# Patient Record
Sex: Female | Born: 1945 | Race: White | Hispanic: No
Health system: Southern US, Community
[De-identification: ages and names within clinical notes are randomized; demographics above are authoritative.]

## PROBLEM LIST (undated history)

## (undated) DIAGNOSIS — G629 Polyneuropathy, unspecified: Secondary | ICD-10-CM

## (undated) DIAGNOSIS — I10 Essential (primary) hypertension: Secondary | ICD-10-CM

## (undated) DIAGNOSIS — E119 Type 2 diabetes mellitus without complications: Secondary | ICD-10-CM

## (undated) HISTORY — PX: TOE AMPUTATION: SHX809

---

## 2004-03-28 ENCOUNTER — Ambulatory Visit: Payer: Self-pay | Admitting: Anesthesiology

## 2004-04-24 ENCOUNTER — Ambulatory Visit: Payer: Self-pay | Admitting: Anesthesiology

## 2004-05-21 ENCOUNTER — Ambulatory Visit: Payer: Self-pay | Admitting: Anesthesiology

## 2004-06-18 ENCOUNTER — Ambulatory Visit: Payer: Self-pay | Admitting: Anesthesiology

## 2004-09-17 ENCOUNTER — Ambulatory Visit: Payer: Self-pay | Admitting: Anesthesiology

## 2004-10-10 ENCOUNTER — Ambulatory Visit: Payer: Self-pay | Admitting: Anesthesiology

## 2007-07-14 ENCOUNTER — Emergency Department (HOSPITAL_COMMUNITY): Admission: EM | Admit: 2007-07-14 | Discharge: 2007-07-14 | Payer: Self-pay | Admitting: Emergency Medicine

## 2007-07-29 ENCOUNTER — Encounter (INDEPENDENT_AMBULATORY_CARE_PROVIDER_SITE_OTHER): Payer: Self-pay | Admitting: *Deleted

## 2007-07-29 ENCOUNTER — Ambulatory Visit: Payer: Self-pay

## 2007-10-31 ENCOUNTER — Emergency Department (HOSPITAL_COMMUNITY): Admission: EM | Admit: 2007-10-31 | Discharge: 2007-10-31 | Payer: Self-pay | Admitting: Emergency Medicine

## 2008-09-27 ENCOUNTER — Emergency Department: Payer: Self-pay | Admitting: Emergency Medicine

## 2008-10-10 ENCOUNTER — Ambulatory Visit: Payer: Self-pay | Admitting: Family Medicine

## 2009-09-27 ENCOUNTER — Inpatient Hospital Stay (HOSPITAL_COMMUNITY): Admission: EM | Admit: 2009-09-27 | Discharge: 2009-09-30 | Payer: Self-pay | Admitting: Emergency Medicine

## 2009-09-27 ENCOUNTER — Encounter: Payer: Self-pay | Admitting: Internal Medicine

## 2009-09-27 ENCOUNTER — Ambulatory Visit: Payer: Self-pay | Admitting: Internal Medicine

## 2009-09-28 ENCOUNTER — Ambulatory Visit: Payer: Self-pay | Admitting: Internal Medicine

## 2010-07-08 LAB — BASIC METABOLIC PANEL
BUN: 15 mg/dL (ref 6–23)
BUN: 28 mg/dL — ABNORMAL HIGH (ref 6–23)
CO2: 31 mEq/L (ref 19–32)
Calcium: 8.2 mg/dL — ABNORMAL LOW (ref 8.4–10.5)
Calcium: 8.7 mg/dL (ref 8.4–10.5)
Chloride: 101 mEq/L (ref 96–112)
Chloride: 106 mEq/L (ref 96–112)
Chloride: 99 mEq/L (ref 96–112)
Creatinine, Ser: 0.89 mg/dL (ref 0.4–1.2)
Creatinine, Ser: 0.94 mg/dL (ref 0.4–1.2)
Creatinine, Ser: 1.28 mg/dL — ABNORMAL HIGH (ref 0.4–1.2)
GFR calc Af Amer: 51 mL/min — ABNORMAL LOW (ref >60)
GFR calc non Af Amer: 42 mL/min — ABNORMAL LOW (ref >60)
GFR calc non Af Amer: >60 mL/min (ref >60)
Glucose, Bld: 158 mg/dL — ABNORMAL HIGH (ref 70–99)
Glucose, Bld: 183 mg/dL — ABNORMAL HIGH (ref 70–99)
Glucose, Bld: 191 mg/dL — ABNORMAL HIGH (ref 70–99)
Potassium: 3.8 mEq/L (ref 3.5–5.1)
Potassium: 3.9 mEq/L (ref 3.5–5.1)
Sodium: 138 mEq/L (ref 135–145)

## 2010-07-08 LAB — DIFFERENTIAL
Eosinophils Relative: 2 % (ref 0–5)
Lymphs Abs: 2 10*3/uL (ref 0.7–4.0)
Monocytes Relative: 8 % (ref 3–12)
Neutro Abs: 5.8 10*3/uL (ref 1.7–7.7)
Neutrophils Relative %: 67 % (ref 43–77)

## 2010-07-08 LAB — CBC
HCT: 27.4 % — ABNORMAL LOW (ref 36.0–46.0)
HCT: 28 % — ABNORMAL LOW (ref 36.0–46.0)
HCT: 30.8 % — ABNORMAL LOW (ref 36.0–46.0)
Hemoglobin: 10.6 g/dL — ABNORMAL LOW (ref 12.0–15.0)
Hemoglobin: 9.2 g/dL — ABNORMAL LOW (ref 12.0–15.0)
MCHC: 33.6 g/dL (ref 30.0–36.0)
MCHC: 34.3 g/dL (ref 30.0–36.0)
MCHC: 34.4 g/dL (ref 30.0–36.0)
MCV: 90.2 fL (ref 78.0–100.0)
MCV: 90.4 fL (ref 78.0–100.0)
Platelets: 190 10*3/uL (ref 150–400)
Platelets: 205 10*3/uL (ref 150–400)
Platelets: 211 10*3/uL (ref 150–400)
RBC: 3.41 MIL/uL — ABNORMAL LOW (ref 3.87–5.11)
RDW: 12.5 % (ref 11.5–15.5)
RDW: 12.5 % (ref 11.5–15.5)
WBC: 8.6 10*3/uL (ref 4.0–10.5)
WBC: 9.6 10*3/uL (ref 4.0–10.5)

## 2010-07-08 LAB — GLUCOSE, CAPILLARY
Glucose-Capillary: 147 mg/dL — ABNORMAL HIGH (ref 70–99)
Glucose-Capillary: 159 mg/dL — ABNORMAL HIGH (ref 70–99)
Glucose-Capillary: 167 mg/dL — ABNORMAL HIGH (ref 70–99)
Glucose-Capillary: 195 mg/dL — ABNORMAL HIGH (ref 70–99)
Glucose-Capillary: 207 mg/dL — ABNORMAL HIGH (ref 70–99)
Glucose-Capillary: 236 mg/dL — ABNORMAL HIGH (ref 70–99)
Glucose-Capillary: 258 mg/dL — ABNORMAL HIGH (ref 70–99)
Glucose-Capillary: 93 mg/dL (ref 70–99)

## 2010-07-08 LAB — TSH: TSH: 0.542 u[IU]/mL (ref 0.350–4.500)

## 2010-07-08 LAB — LIPID PANEL
Cholesterol: 106 mg/dL (ref 0–200)
LDL Cholesterol: 53 mg/dL (ref 0–99)
Total CHOL/HDL Ratio: 3.3 RATIO

## 2010-07-08 LAB — CULTURE, BLOOD (ROUTINE X 2)

## 2010-07-08 LAB — WOUND CULTURE

## 2010-07-08 LAB — HEMOGLOBIN A1C: Hgb A1c MFr Bld: 7.5 % — ABNORMAL HIGH (ref <5.7)

## 2011-01-13 LAB — CBC
HCT: 37.5
Hemoglobin: 12.7
MCHC: 33.9
MCV: 90.1
Platelets: 206
RDW: 13.6

## 2011-01-13 LAB — POCT I-STAT, CHEM 8
BUN: 27 — ABNORMAL HIGH
Calcium, Ion: 1.2
HCT: 40
Hemoglobin: 13.6
Sodium: 139
TCO2: 31

## 2011-01-13 LAB — DIFFERENTIAL
Basophils Absolute: 0.1
Basophils Relative: 1
Eosinophils Absolute: 0.1
Eosinophils Relative: 1
Monocytes Absolute: 0.6

## 2011-01-13 LAB — POCT CARDIAC MARKERS: Operator id: 295131

## 2011-01-16 LAB — URINALYSIS, ROUTINE W REFLEX MICROSCOPIC
Glucose, UA: 100 — AB
Ketones, ur: NEGATIVE
pH: 6

## 2011-01-16 LAB — URINE MICROSCOPIC-ADD ON

## 2011-01-16 LAB — POCT I-STAT, CHEM 8
BUN: 14
Hemoglobin: 10.2 — ABNORMAL LOW
Potassium: 3.5
Sodium: 134 — ABNORMAL LOW
TCO2: 28

## 2011-06-03 ENCOUNTER — Ambulatory Visit: Payer: Self-pay | Admitting: Podiatry

## 2011-06-07 LAB — WOUND CULTURE

## 2013-05-09 LAB — BASIC METABOLIC PANEL
BUN: 33 mg/dL — AB (ref 4–21)
Creatinine: 1.6 mg/dL — AB (ref 0.5–1.1)
GLUCOSE: 303 mg/dL
Sodium: 138 mmol/L (ref 137–147)

## 2013-11-10 ENCOUNTER — Other Ambulatory Visit: Payer: Self-pay | Admitting: Podiatry

## 2013-11-14 LAB — WOUND CULTURE

## 2013-11-16 ENCOUNTER — Ambulatory Visit: Payer: Self-pay | Admitting: Podiatry

## 2013-11-16 LAB — BASIC METABOLIC PANEL
Anion Gap: 6 — ABNORMAL LOW (ref 7–16)
BUN: 39 mg/dL — ABNORMAL HIGH (ref 7–18)
CALCIUM: 8.9 mg/dL (ref 8.5–10.1)
CHLORIDE: 108 mmol/L — AB (ref 98–107)
Co2: 23 mmol/L (ref 21–32)
Creatinine: 2.06 mg/dL — ABNORMAL HIGH (ref 0.60–1.30)
EGFR (Non-African Amer.): 24 — ABNORMAL LOW
GFR CALC AF AMER: 28 — AB
GLUCOSE: 136 mg/dL — AB (ref 65–99)
Osmolality: 285 (ref 275–301)
Potassium: 5.4 mmol/L — ABNORMAL HIGH (ref 3.5–5.1)
Sodium: 137 mmol/L (ref 136–145)

## 2013-11-16 LAB — WBC: WBC: 7.3 10*3/uL (ref 3.6–11.0)

## 2013-11-16 LAB — HEMOGLOBIN: HGB: 9.8 g/dL — ABNORMAL LOW (ref 12.0–16.0)

## 2013-11-18 ENCOUNTER — Ambulatory Visit: Payer: Self-pay | Admitting: Podiatry

## 2013-11-21 LAB — PATHOLOGY REPORT

## 2013-12-15 LAB — WOUND CULTURE

## 2014-05-11 LAB — HEMOGLOBIN A1C: HEMOGLOBIN A1C: 8.7

## 2014-07-02 ENCOUNTER — Encounter (HOSPITAL_COMMUNITY): Payer: Self-pay | Admitting: Emergency Medicine

## 2014-07-02 ENCOUNTER — Emergency Department (HOSPITAL_COMMUNITY): Payer: Medicare PPO

## 2014-07-02 ENCOUNTER — Inpatient Hospital Stay (HOSPITAL_COMMUNITY)
Admission: EM | Admit: 2014-07-02 | Discharge: 2014-07-05 | DRG: 558 | Disposition: A | Discharge status: Home Health | Payer: Medicare PPO | Attending: Internal Medicine | Admitting: Internal Medicine

## 2014-07-02 DIAGNOSIS — E1142 Type 2 diabetes mellitus with diabetic polyneuropathy: Secondary | ICD-10-CM | POA: Diagnosis present

## 2014-07-02 DIAGNOSIS — Z89422 Acquired absence of other left toe(s): Secondary | ICD-10-CM

## 2014-07-02 DIAGNOSIS — R32 Unspecified urinary incontinence: Secondary | ICD-10-CM | POA: Diagnosis present

## 2014-07-02 DIAGNOSIS — E860 Dehydration: Secondary | ICD-10-CM | POA: Diagnosis present

## 2014-07-02 DIAGNOSIS — M6282 Rhabdomyolysis: Secondary | ICD-10-CM | POA: Diagnosis not present

## 2014-07-02 DIAGNOSIS — T501X5A Adverse effect of loop [high-ceiling] diuretics, initial encounter: Secondary | ICD-10-CM | POA: Diagnosis present

## 2014-07-02 DIAGNOSIS — N390 Urinary tract infection, site not specified: Secondary | ICD-10-CM | POA: Diagnosis present

## 2014-07-02 DIAGNOSIS — W19XXXA Unspecified fall, initial encounter: Secondary | ICD-10-CM | POA: Diagnosis present

## 2014-07-02 DIAGNOSIS — G629 Polyneuropathy, unspecified: Secondary | ICD-10-CM

## 2014-07-02 DIAGNOSIS — I10 Essential (primary) hypertension: Secondary | ICD-10-CM | POA: Diagnosis present

## 2014-07-02 DIAGNOSIS — T796XXA Traumatic ischemia of muscle, initial encounter: Secondary | ICD-10-CM

## 2014-07-02 DIAGNOSIS — F17210 Nicotine dependence, cigarettes, uncomplicated: Secondary | ICD-10-CM | POA: Diagnosis present

## 2014-07-02 DIAGNOSIS — N179 Acute kidney failure, unspecified: Secondary | ICD-10-CM | POA: Diagnosis present

## 2014-07-02 DIAGNOSIS — R531 Weakness: Secondary | ICD-10-CM

## 2014-07-02 DIAGNOSIS — D638 Anemia in other chronic diseases classified elsewhere: Secondary | ICD-10-CM | POA: Diagnosis present

## 2014-07-02 DIAGNOSIS — T464X5A Adverse effect of angiotensin-converting-enzyme inhibitors, initial encounter: Secondary | ICD-10-CM | POA: Diagnosis present

## 2014-07-02 DIAGNOSIS — E119 Type 2 diabetes mellitus without complications: Secondary | ICD-10-CM

## 2014-07-02 HISTORY — DX: Polyneuropathy, unspecified: G62.9

## 2014-07-02 HISTORY — DX: Type 2 diabetes mellitus without complications: E11.9

## 2014-07-02 HISTORY — DX: Essential (primary) hypertension: I10

## 2014-07-02 LAB — COMPREHENSIVE METABOLIC PANEL
ALBUMIN: 3.5 g/dL (ref 3.5–5.2)
ALT: 40 U/L — AB (ref 0–35)
AST: 62 U/L — ABNORMAL HIGH (ref 0–37)
Alkaline Phosphatase: 71 U/L (ref 39–117)
Anion gap: 12 (ref 5–15)
BUN: 33 mg/dL — ABNORMAL HIGH (ref 6–23)
CO2: 24 mmol/L (ref 19–32)
Calcium: 9.7 mg/dL (ref 8.4–10.5)
Chloride: 102 mmol/L (ref 96–112)
Creatinine, Ser: 1.46 mg/dL — ABNORMAL HIGH (ref 0.50–1.10)
GFR calc Af Amer: 41 mL/min — ABNORMAL LOW (ref >90)
GFR calc non Af Amer: 36 mL/min — ABNORMAL LOW (ref >90)
Glucose, Bld: 206 mg/dL — ABNORMAL HIGH (ref 70–99)
Potassium: 4.1 mmol/L (ref 3.5–5.1)
Sodium: 138 mmol/L (ref 135–145)
Total Bilirubin: 0.9 mg/dL (ref 0.3–1.2)
Total Protein: 7.5 g/dL (ref 6.0–8.3)

## 2014-07-02 LAB — CK: Total CK: 2449 U/L — ABNORMAL HIGH (ref 7–177)

## 2014-07-02 LAB — URINALYSIS, ROUTINE W REFLEX MICROSCOPIC
Bilirubin Urine: NEGATIVE
Glucose, UA: NEGATIVE mg/dL
KETONES UR: 15 mg/dL — AB
NITRITE: POSITIVE — AB
PH: 5 (ref 5.0–8.0)
PROTEIN: NEGATIVE mg/dL
Specific Gravity, Urine: 1.016 (ref 1.005–1.030)
Urobilinogen, UA: 0.2 mg/dL (ref 0.0–1.0)

## 2014-07-02 LAB — CBC WITH DIFFERENTIAL/PLATELET
BASOS ABS: 0 10*3/uL (ref 0.0–0.1)
BASOS PCT: 0 % (ref 0–1)
EOS PCT: 1 % (ref 0–5)
Eosinophils Absolute: 0.1 10*3/uL (ref 0.0–0.7)
HEMATOCRIT: 36.1 % (ref 36.0–46.0)
HEMOGLOBIN: 12.1 g/dL (ref 12.0–15.0)
LYMPHS ABS: 1.6 10*3/uL (ref 0.7–4.0)
LYMPHS PCT: 16 % (ref 12–46)
MCH: 30.4 pg (ref 26.0–34.0)
MCHC: 33.5 g/dL (ref 30.0–36.0)
MCV: 90.7 fL (ref 78.0–100.0)
Monocytes Absolute: 0.6 10*3/uL (ref 0.1–1.0)
Monocytes Relative: 6 % (ref 3–12)
NEUTROS PCT: 77 % (ref 43–77)
Neutro Abs: 8 10*3/uL — ABNORMAL HIGH (ref 1.7–7.7)
PLATELETS: 182 10*3/uL (ref 150–400)
RBC: 3.98 MIL/uL (ref 3.87–5.11)
RDW: 12.3 % (ref 11.5–15.5)
WBC: 10.4 10*3/uL (ref 4.0–10.5)

## 2014-07-02 LAB — URINE MICROSCOPIC-ADD ON

## 2014-07-02 LAB — I-STAT TROPONIN, ED: Troponin i, poc: 0.07 ng/mL (ref 0.00–0.08)

## 2014-07-02 LAB — I-STAT CG4 LACTIC ACID, ED: Lactic Acid, Venous: 1.35 mmol/L (ref 0.5–2.0)

## 2014-07-02 MED ORDER — SODIUM CHLORIDE 0.9 % IV BOLUS (SEPSIS)
1000.0000 mL | Freq: Once | INTRAVENOUS | Status: AC
  Administered 2014-07-02: 1000 mL via INTRAVENOUS

## 2014-07-02 MED ORDER — DEXTROSE 5 % IV SOLN
1.0000 g | Freq: Once | INTRAVENOUS | Status: AC
  Administered 2014-07-03: 1 g via INTRAVENOUS
  Filled 2014-07-02: qty 10

## 2014-07-02 MED ORDER — SODIUM CHLORIDE 0.9 % IV BOLUS (SEPSIS)
1000.0000 mL | Freq: Once | INTRAVENOUS | Status: AC
Start: 2014-07-03 — End: 2014-07-03
  Administered 2014-07-03: 1000 mL via INTRAVENOUS

## 2014-07-02 NOTE — ED Provider Notes (Signed)
CSN: [id number]     Arrival date & time 07/02/14  2057 History   First MD Initiated Contact with Patient 07/02/14 2102     Chief Complaint  Patient presents with  . Fall  . Extremity Weakness     (Consider location/radiation/quality/duration/timing/severity/associated sxs/prior Treatment) Patient is a 69 y.o. female presenting with fall and extremity weakness. The history is provided by the patient.  Fall This is a recurrent problem. Episode onset: several falls over the last 2 months but 3 falls in the last 2 days.   The problem occurs constantly. The problem has been gradually worsening. Pertinent negatives include no chest pain, no abdominal pain, no headaches and no shortness of breath. Associated symptoms comments: No fever, appetite change, syncope, N/V, abdominal pain, SOB, chest pain.  Pt states generalized weakness adn once she falls unable to get up and lays on the floor until someone finds her. The symptoms are aggravated by walking. Nothing relieves the symptoms. She has tried nothing for the symptoms. The treatment provided no relief.  Extremity Weakness Pertinent negatives include no chest pain, no abdominal pain, no headaches and no shortness of breath.    Past Medical History  Diagnosis Date  . Diabetes mellitus without complication   . Hypertension   . Neuropathy    History reviewed. No pertinent past surgical history. History reviewed. No pertinent family history. History  Substance Use Topics  . Smoking status: Not on file  . Smokeless tobacco: Not on file  . Alcohol Use: Not on file   OB History    No data available     Review of Systems  Respiratory: Negative for shortness of breath.   Cardiovascular: Negative for chest pain.  Gastrointestinal: Negative for abdominal pain.  Musculoskeletal: Positive for extremity weakness.  Neurological: Negative for headaches.       Peripheral neuropathy  All other systems reviewed and are negative.     Allergies   Review of patient's allergies indicates no known allergies.  Home Medications   Prior to Admission medications   Not on File   Pulse 84  Temp(Src) 98.6 F (37 C) (Oral)  SpO2 99% Physical Exam  Constitutional: She is oriented to person, place, and time. She appears well-developed and well-nourished. No distress.  Pungent smell of urine when you walk into the patient's room  HENT:  Head: Normocephalic and atraumatic.  Mouth/Throat: Oropharynx is clear and moist. Mucous membranes are dry.  Eyes: Conjunctivae and EOM are normal. Pupils are equal, round, and reactive to light.  Neck: Normal range of motion. Neck supple.  Cardiovascular: Normal rate, regular rhythm and intact distal pulses.   No murmur heard. Pulmonary/Chest: Effort normal and breath sounds normal. No respiratory distress. She has no wheezes. She has no rales.  Abdominal: Soft. She exhibits no distension. There is no tenderness. There is no rebound and no guarding.  Blisters and open wound over the lower abd.  Only minimal erythema  Musculoskeletal: Normal range of motion. She exhibits no edema or tenderness.  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 2 superficial ulcers on the right first and third plantar surface of the digit  Neurological: She is alert and oriented to person, place, and time.  4/5 strength in the left lower extremity. 5 out of 5 strength in the right lower extremity. 5 out of 5 strength in the upper extremities bilaterally  Skin: Skin is warm and dry. No rash noted. No erythema.  Psychiatric: She has a normal mood and affect.  Her behavior is normal.  Nursing note and vitals reviewed.   ED Course  Procedures (including critical care time) Labs Review Labs Reviewed  CBC WITH DIFFERENTIAL/PLATELET - Abnormal; Notable for the following:    Neutro Abs 8.0 (*)    All other components within normal limits  COMPREHENSIVE METABOLIC PANEL - Abnormal; Notable for the following:    Glucose, Bld 206 (*)     BUN 33 (*)    Creatinine, Ser 1.46 (*)    AST 62 (*)    ALT 40 (*)    GFR calc non Af Amer 36 (*)    GFR calc Af Amer 41 (*)    All other components within normal limits  URINALYSIS, ROUTINE W REFLEX MICROSCOPIC - Abnormal; Notable for the following:    APPearance CLOUDY (*)    Hgb urine dipstick MODERATE (*)    Ketones, ur 15 (*)    Nitrite POSITIVE (*)    Leukocytes, UA LARGE (*)    All other components within normal limits  CK - Abnormal; Notable for the following:    Total CK 2449 (*)    All other components within normal limits  URINE MICROSCOPIC-ADD ON - Abnormal; Notable for the following:    Bacteria, UA MANY (*)    All other components within normal limits  URINE CULTURE  TSH  I-STAT TROPOININ, ED  I-STAT CG4 LACTIC ACID, ED    Imaging Review Dg Chest 2 View  07/02/2014   CLINICAL DATA:  Larey Seat twice today.  Weakness.  EXAM: CHEST  2 VIEW  COMPARISON:  None.  FINDINGS: Lungs are adequately inflated without consolidation or effusion. Cardiomediastinal silhouette is within normal. Mild calcified plaque over the thoracic aorta. There are mild degenerative changes of the spine. No fracture.  IMPRESSION: No active cardiopulmonary disease.   Electronically Signed   By: Elberta Fortis M.D.   On: 07/02/2014 22:08   Ct Head Wo Contrast  07/02/2014   CLINICAL DATA:  Bilateral leg weakness for 2 months. Two falls today. Initial encounter.  EXAM: CT HEAD WITHOUT CONTRAST  TECHNIQUE: Contiguous axial images were obtained from the base of the skull through the vertex without intravenous contrast.  COMPARISON:  None.  FINDINGS: There is no evidence of acute infarction, mass lesion, or intra- or extra-axial hemorrhage on CT.  Prominence of the ventricles and sulci suggests mild cortical volume loss. Mild periventricular and subcortical white matter change likely reflects small vessel ischemic microangiopathy.  The brainstem and fourth ventricle are within normal limits. The basal ganglia are  unremarkable in appearance. The cerebral hemispheres demonstrate grossly normal gray-white differentiation. No mass effect or midline shift is seen.  There is no evidence of fracture; visualized osseous structures are unremarkable in appearance. The orbits are within normal limits. The paranasal sinuses and mastoid air cells are well-aerated. No significant soft tissue abnormalities are seen.  IMPRESSION: 1. No acute intracranial pathology seen on CT. 2. Mild cortical volume loss and scattered small vessel ischemic microangiopathy.   Electronically Signed   By: Roanna Raider M.D.   On: 07/02/2014 21:59     EKG Interpretation None      MDM   Final diagnoses:  Weakness    Patient with multiple falls at home today and feeling so weak she was unable to get up. She's had occasional falls over the last few months but several more recently in the last week. Patient does have diabetes and neuropathy and has decreased sensation in her lower extremities. She  has felt more weak recently and today laid on the floor for multiple hours waiting for someone to find her to help her get up. She denies any change in appetite, fever, shortness of breath, chest pain, abdominal pain, dysuria. However she has frequent urinary incontinence.  On exam patient has no localized pain but healing wounds and blisters over the abdomen where she states she laid down in the sun and got a sunburn. Lungs are clear, heart rate and blood pressure within normal limits. She does have noted weakness in the left lower extremity which she states is chronic.  CBC, CMP, UA, CK, troponin, lactic acid, chest x-ray, head CT, EKG pending  12:09 AM Patient found to have rhabdomyolysis, UTI, increase in her creatinine from 0.8 to 1.46.  Pt give IVF as well as rocephin.  Will admit for further care.  Gwyneth Sprout, MD 07/03/14 [phone]

## 2014-07-02 NOTE — ED Notes (Signed)
Per EMS: pt reports bilateral leg weakness for 2 months and took 2 falls today. Pt fell the first time and ems arrived, pt wanted to drive self to ED, EMS left scene.  Pt fell for a second time and EMS brought pt to emergency dept.  Pt c/o no pain, denies head or neck trauma.  Pt alert and oriented.  140/90, 273 cbg, 98%, 90hr.

## 2014-07-03 DIAGNOSIS — N390 Urinary tract infection, site not specified: Secondary | ICD-10-CM | POA: Diagnosis present

## 2014-07-03 DIAGNOSIS — E119 Type 2 diabetes mellitus without complications: Secondary | ICD-10-CM | POA: Diagnosis not present

## 2014-07-03 DIAGNOSIS — M6282 Rhabdomyolysis: Secondary | ICD-10-CM | POA: Diagnosis not present

## 2014-07-03 DIAGNOSIS — G629 Polyneuropathy, unspecified: Secondary | ICD-10-CM | POA: Diagnosis not present

## 2014-07-03 DIAGNOSIS — R531 Weakness: Secondary | ICD-10-CM

## 2014-07-03 DIAGNOSIS — E860 Dehydration: Secondary | ICD-10-CM | POA: Diagnosis present

## 2014-07-03 DIAGNOSIS — I10 Essential (primary) hypertension: Secondary | ICD-10-CM | POA: Diagnosis present

## 2014-07-03 LAB — CBC
HCT: 33.7 % — ABNORMAL LOW (ref 36.0–46.0)
HEMOGLOBIN: 10.9 g/dL — AB (ref 12.0–15.0)
MCH: 30.3 pg (ref 26.0–34.0)
MCHC: 32.3 g/dL (ref 30.0–36.0)
MCV: 93.6 fL (ref 78.0–100.0)
Platelets: 169 10*3/uL (ref 150–400)
RBC: 3.6 MIL/uL — ABNORMAL LOW (ref 3.87–5.11)
RDW: 12.7 % (ref 11.5–15.5)
WBC: 9.4 10*3/uL (ref 4.0–10.5)

## 2014-07-03 LAB — GLUCOSE, CAPILLARY
GLUCOSE-CAPILLARY: 192 mg/dL — AB (ref 70–99)
Glucose-Capillary: 165 mg/dL — ABNORMAL HIGH (ref 70–99)
Glucose-Capillary: 234 mg/dL — ABNORMAL HIGH (ref 70–99)
Glucose-Capillary: 158 mg/dL — ABNORMAL HIGH (ref 70–99)

## 2014-07-03 LAB — CREATININE, SERUM
CREATININE: 1.51 mg/dL — AB (ref 0.50–1.10)
GFR calc Af Amer: 40 mL/min — ABNORMAL LOW (ref >90)
GFR, EST NON AFRICAN AMERICAN: 34 mL/min — AB (ref >90)

## 2014-07-03 LAB — TSH: TSH: 0.491 u[IU]/mL (ref 0.350–4.500)

## 2014-07-03 MED ORDER — HYDROMORPHONE HCL 1 MG/ML IJ SOLN: 0.5000 mg | INTRAMUSCULAR | Status: DC | PRN

## 2014-07-03 MED ORDER — ACETAMINOPHEN 650 MG RE SUPP: 650.0000 mg | Freq: Four times a day (QID) | RECTAL | Status: DC | PRN

## 2014-07-03 MED ORDER — ASPIRIN 81 MG PO CHEW
81.0000 mg | CHEWABLE_TABLET | Freq: Every day | ORAL | Status: DC
  Administered 2014-07-03 – 2014-07-05 (×3): 81 mg via ORAL
  Filled 2014-07-03 (×5): qty 1

## 2014-07-03 MED ORDER — GLIPIZIDE 5 MG PO TABS
10.0000 mg | ORAL_TABLET | Freq: Two times a day (BID) | ORAL | Status: DC
  Administered 2014-07-03 – 2014-07-05 (×4): 10 mg via ORAL
  Filled 2014-07-03 (×4): qty 2

## 2014-07-03 MED ORDER — ALUM & MAG HYDROXIDE-SIMETH 200-200-20 MG/5ML PO SUSP: 30.0000 mL | Freq: Four times a day (QID) | ORAL | Status: DC | PRN

## 2014-07-03 MED ORDER — ONDANSETRON HCL 4 MG/2ML IJ SOLN: 4.0000 mg | Freq: Four times a day (QID) | INTRAMUSCULAR | Status: DC | PRN

## 2014-07-03 MED ORDER — GUAIFENESIN-DM 100-10 MG/5ML PO SYRP: 5.0000 mL | ORAL_SOLUTION | ORAL | Status: DC | PRN

## 2014-07-03 MED ORDER — OXYCODONE HCL 5 MG PO TABS: 5.0000 mg | ORAL_TABLET | ORAL | Status: DC | PRN

## 2014-07-03 MED ORDER — ACETAMINOPHEN 650 MG RE SUPP
650.0000 mg | Freq: Four times a day (QID) | RECTAL | Status: DC | PRN
Start: 2014-07-03 — End: 2014-07-03

## 2014-07-03 MED ORDER — ONDANSETRON HCL 4 MG PO TABS: 4.0000 mg | ORAL_TABLET | Freq: Four times a day (QID) | ORAL | Status: DC | PRN

## 2014-07-03 MED ORDER — GABAPENTIN 400 MG PO CAPS
400.0000 mg | ORAL_CAPSULE | Freq: Three times a day (TID) | ORAL | Status: DC
  Administered 2014-07-03 – 2014-07-05 (×6): 400 mg via ORAL
  Filled 2014-07-03 (×12): qty 1

## 2014-07-03 MED ORDER — ACETAMINOPHEN 325 MG PO TABS: 650.0000 mg | ORAL_TABLET | Freq: Four times a day (QID) | ORAL | Status: DC | PRN

## 2014-07-03 MED ORDER — INSULIN ASPART 100 UNIT/ML ~~LOC~~ SOLN: 0.0000 [IU] | Freq: Every day | SUBCUTANEOUS | Status: DC

## 2014-07-03 MED ORDER — GABAPENTIN 800 MG PO TABS
400.0000 mg | ORAL_TABLET | Freq: Three times a day (TID) | ORAL | Status: DC
  Filled 2014-07-03 (×2): qty 0.5

## 2014-07-03 MED ORDER — SODIUM CHLORIDE 0.9 % IV SOLN
INTRAVENOUS | Status: DC
  Administered 2014-07-03 – 2014-07-04 (×2): via INTRAVENOUS

## 2014-07-03 MED ORDER — INSULIN ASPART 100 UNIT/ML ~~LOC~~ SOLN
0.0000 [IU] | Freq: Three times a day (TID) | SUBCUTANEOUS | Status: DC
  Administered 2014-07-03: 2 [IU] via SUBCUTANEOUS
  Administered 2014-07-03 – 2014-07-04 (×2): 3 [IU] via SUBCUTANEOUS
  Administered 2014-07-04 – 2014-07-05 (×2): 2 [IU] via SUBCUTANEOUS
  Administered 2014-07-05: 1 [IU] via SUBCUTANEOUS

## 2014-07-03 MED ORDER — ALBUTEROL SULFATE (2.5 MG/3ML) 0.083% IN NEBU: 2.5000 mg | INHALATION_SOLUTION | RESPIRATORY_TRACT | Status: DC | PRN

## 2014-07-03 MED ORDER — CEFTRIAXONE SODIUM IN DEXTROSE 20 MG/ML IV SOLN
1.0000 g | INTRAVENOUS | Status: DC
  Administered 2014-07-03 – 2014-07-04 (×2): 1 g via INTRAVENOUS
  Filled 2014-07-03 (×3): qty 50

## 2014-07-03 MED ORDER — HYDRALAZINE HCL 20 MG/ML IJ SOLN: 5.0000 mg | INTRAMUSCULAR | Status: DC | PRN

## 2014-07-03 MED ORDER — INFLUENZA VAC SPLIT QUAD 0.5 ML IM SUSY: 0.5000 mL | PREFILLED_SYRINGE | INTRAMUSCULAR | Status: DC

## 2014-07-03 MED ORDER — MUPIROCIN 2 % EX OINT
TOPICAL_OINTMENT | Freq: Every day | CUTANEOUS | Status: DC
  Administered 2014-07-03 – 2014-07-04 (×2): via TOPICAL
  Filled 2014-07-03: qty 22

## 2014-07-03 MED ORDER — HEPARIN SODIUM (PORCINE) 5000 UNIT/ML IJ SOLN
5000.0000 [IU] | Freq: Three times a day (TID) | INTRAMUSCULAR | Status: DC
  Administered 2014-07-03 – 2014-07-05 (×6): 5000 [IU] via SUBCUTANEOUS
  Filled 2014-07-03 (×6): qty 1

## 2014-07-03 MED ORDER — GABAPENTIN 400 MG PO CAPS: 400.0000 mg | ORAL_CAPSULE | Freq: Three times a day (TID) | ORAL | Status: DC

## 2014-07-03 MED ORDER — SODIUM CHLORIDE 0.9 % IV SOLN
INTRAVENOUS | Status: DC
  Administered 2014-07-03: 01:00:00 via INTRAVENOUS

## 2014-07-03 MED ORDER — CEFTRIAXONE SODIUM IN DEXTROSE 20 MG/ML IV SOLN: 1.0000 g | INTRAVENOUS | Status: DC

## 2014-07-03 NOTE — Progress Notes (Signed)
Spoke with MD who stated that he would address admitting orders when he arrives to facility. Called pharmacy to do medication requisition.

## 2014-07-03 NOTE — Progress Notes (Signed)
IV to right forearm came out, malposition. Catheter still intact. Bleeding noted. Dry dressing applied. Unable to restart. IV team consult order placed.

## 2014-07-03 NOTE — Progress Notes (Signed)
PATIENT DETAILS Name: Bridget Austin Age: 69 y.o. Sex: female Date of Birth: 1946-03-12 Admit Date: 07/02/2014 Admitting Physician Ron Parker, MD PCP:No primary care provider on file.  Subjective: No major complaints overnight.  Assessment/Plan: Principal Problem:   Rhabdomyolysis: Secondary to prolonged immobilization/being on the floor. Continue IV fluids. Recheck CK in a.m.  Active Problems:   UTI (lower urinary tract infection): Continue with IV Rocephin. Await culture data. Afebrile and without any leukocytosis.    Dehydration: Secondary to above. Hydrate, and reassess volume status in a.m.    Acute renal failure: Likely secondary to prerenal azotemia from above and from lisinopril/Lasix. Hydrate, recheck in a.m. Hold lisinopril/Lasix.    Generalized Weakness: Likely secondary to dehydration and UTI. CT head negative for acute changes. Nonfocal exam. PT eval pending.    Diabetes mellitus without complication: CBGs with moderate control-Will restart glipizide, continue since I. Resume rest of her medications on discharge.Check A1c.    Hypertension: Blood pressure with moderate control-currently off all antihypertensive medication, we will resume when able especially when her is renal function better.    Anemia: Likely secondary to chronic disease. Further workup/monitoring deferred to the outpatient setting    Neuropathy: resume Neurontin  Disposition: Remain inpatient  Antibiotics:  See below   Anti-infectives    Start     Dose/Rate Route Frequency Ordered Stop   07/03/14 2359  cefTRIAXone (ROCEPHIN) 1 g in dextrose 5 % 50 mL IVPB - Premix  Status:  Discontinued     1 g 100 mL/hr over 30 Minutes Intravenous Every 24 hours 07/03/14 0826 07/03/14 0833   07/03/14 2300  cefTRIAXone (ROCEPHIN) 1 g in dextrose 5 % 50 mL IVPB - Premix     1 g 100 mL/hr over 30 Minutes Intravenous Every 24 hours 07/03/14 0822     07/03/14 0000  cefTRIAXone (ROCEPHIN) 1 g  in dextrose 5 % 50 mL IVPB     1 g 100 mL/hr over 30 Minutes Intravenous  Once 07/02/14 2350 07/03/14 0030      DVT Prophylaxis: Prophylactic Heparin   Code Status: Full code   Family Communication None at bedside  Procedures:  None  CONSULTS:  None  Time spent 40 minutes-which includes 50% of the time with face-to-face with patient/ family and coordinating care related to the above assessment and plan.  MEDICATIONS: Scheduled Meds: . cefTRIAXone (ROCEPHIN)  IV  1 g Intravenous Q24H  . heparin  5,000 Units Subcutaneous 3 times per day  . insulin aspart  0-5 Units Subcutaneous QHS  . insulin aspart  0-9 Units Subcutaneous TID WC   Continuous Infusions: . sodium chloride 75 mL/hr at 07/03/14 0841   PRN Meds:.acetaminophen **OR** acetaminophen, albuterol, alum & mag hydroxide-simeth, guaiFENesin-dextromethorphan, hydrALAZINE, HYDROmorphone (DILAUDID) injection, ondansetron **OR** ondansetron (ZOFRAN) IV, oxyCODONE    PHYSICAL EXAM: Vital signs in last 24 hours: Filed Vitals:   07/03/14 0030 07/03/14 0100 07/03/14 0546 07/03/14 1020  BP: 153/54 162/78 147/63 132/48  Pulse: 83 86 71 77  Temp:  97.5 F (36.4 C) 98.5 F (36.9 C) 98.4 F (36.9 C)  TempSrc:  Oral Oral Oral  Resp: Height:   (1.676 m)    Weight:  97.478 kg (214 lb 14.4 oz)    SpO2: 97% 96% 97% 98%    Weight change:  Filed Weights   07/03/14 0100  Weight: 97.478 kg (214 lb 14.4 oz)   Body mass index is 34.7  kg/(m^2).   Gen Exam: Awake and alert with clear speech.   Neck: Supple, No JVD.   Chest: B/L Clear.   CVS: S1 S2 Regular, no murmurs.  Abdomen: soft, BS +, non tender, non distended.  Extremities: no edema, lower extremities warm to touch. Neurologic: Non Focal.   Skin: No Rash.   Wounds: N/A.    Intake/Output from previous day: No intake or output data in the 24 hours ending 07/03/14 1211   LAB RESULTS: CBC  Recent Labs Lab 07/02/14 2205 07/03/14 1004  WBC  10.4 9.4  HGB 12.1 10.9*  HCT 36.1 33.7*  PLT 182 169  MCV 90.7 93.6  MCH 30.4 30.3  MCHC 33.5 32.3  RDW 12.3 12.7  LYMPHSABS 1.6  --   MONOABS 0.6  --   EOSABS 0.1  --   BASOSABS 0.0  --     Chemistries   Recent Labs Lab 07/02/14 2205 07/03/14 1004  NA 138  --   K 4.1  --   CL 102  --   CO2 24  --   GLUCOSE 206*  --   BUN 33*  --   CREATININE 1.46* 1.51*  CALCIUM 9.7  --     CBG:  Recent Labs Lab 07/03/14 0841 07/03/14 1152  GLUCAP 165* 234*    GFR Estimated Creatinine Clearance: 42 mL/min (by C-G formula based on Cr of 1.51).  Coagulation profile No results for input(s): INR, PROTIME in the last 168 hours.  Cardiac Enzymes No results for input(s): CKMB, TROPONINI, MYOGLOBIN in the last 168 hours.  Invalid input(s): CK  Invalid input(s): POCBNP No results for input(s): DDIMER in the last 72 hours. No results for input(s): HGBA1C in the last 72 hours. No results for input(s): CHOL, HDL, LDLCALC, TRIG, CHOLHDL, LDLDIRECT in the last 72 hours.  Recent Labs  07/03/14 0010  TSH 0.491   No results for input(s): VITAMINB12, FOLATE, FERRITIN, TIBC, IRON, RETICCTPCT in the last 72 hours. No results for input(s): LIPASE, AMYLASE in the last 72 hours.  Urine Studies No results for input(s): UHGB, CRYS in the last 72 hours.  Invalid input(s): UACOL, UAPR, USPG, UPH, UTP, UGL, UKET, UBIL, UNIT, UROB, ULEU, UEPI, UWBC, URBC, UBAC, CAST, UCOM, BILUA  MICROBIOLOGY: Recent Results (from the past 240 hour(s))  Culture, blood (routine x 2)     Status: None (Preliminary result)   Collection Time: 07/03/14 10:04 AM  Result Value Ref Range Status   Specimen Description BLOOD LEFT ANTECUBITAL  Final   Special Requests BOTTLES DRAWN AEROBIC AND ANAEROBIC 10CC  Final   Culture PENDING  Incomplete   Report Status PENDING  Incomplete    RADIOLOGY STUDIES/RESULTS: Dg Chest 2 View  07/02/2014   CLINICAL DATA:  Larey Seat twice today.  Weakness.  EXAM: CHEST  2 VIEW   COMPARISON:  None.  FINDINGS: Lungs are adequately inflated without consolidation or effusion. Cardiomediastinal silhouette is within normal. Mild calcified plaque over the thoracic aorta. There are mild degenerative changes of the spine. No fracture.  IMPRESSION: No active cardiopulmonary disease.   Electronically Signed   By: Elberta Fortis M.D.   On: 07/02/2014 22:08   Ct Head Wo Contrast  07/02/2014   CLINICAL DATA:  Bilateral leg weakness for 2 months. Two falls today. Initial encounter.  EXAM: CT HEAD WITHOUT CONTRAST  TECHNIQUE: Contiguous axial images were obtained from the base of the skull through the vertex without intravenous contrast.  COMPARISON:  None.  FINDINGS: There is no evidence  of acute infarction, mass lesion, or intra- or extra-axial hemorrhage on CT.  Prominence of the ventricles and sulci suggests mild cortical volume loss. Mild periventricular and subcortical white matter change likely reflects small vessel ischemic microangiopathy.  The brainstem and fourth ventricle are within normal limits. The basal ganglia are unremarkable in appearance. The cerebral hemispheres demonstrate grossly normal gray-white differentiation. No mass effect or midline shift is seen.  There is no evidence of fracture; visualized osseous structures are unremarkable in appearance. The orbits are within normal limits. The paranasal sinuses and mastoid air cells are well-aerated. No significant soft tissue abnormalities are seen.  IMPRESSION: 1. No acute intracranial pathology seen on CT. 2. Mild cortical volume loss and scattered small vessel ischemic microangiopathy.   Electronically Signed   By: Roanna RaiderJeffery  Chang M.D.   On: 07/02/2014 21:59    Jeoffrey MassedGHIMIRE,SHANKER, MD  [facility] Pager:[phone]  If 7PM-7AM, please contact night-coverage [email or web address] Password [email or web address] 07/03/2014, 12:11 PM   LOS: 0 days

## 2014-07-03 NOTE — Progress Notes (Addendum)
Patient received to 4N05 at 0045 alert & oriented no complaints voiced. MD was paged several times awaiting new orders.

## 2014-07-03 NOTE — Consult Note (Addendum)
WOC wound consult note Reason for Consult: Consult requested for right toes and abd wounds.  Wound type: Pt states she recently had a sunburn and blistering to skin on left abd; this has evolved to patchy areas of dry scabs; location is in a linear fashion near where previous pants or belt may have rubbed: .8X.8cm, .2X.2cm, .1X.1cm.  All sites are dry dark red scabs without odor or drainage.   Measurement: Right plantar toe with partial thickness wound, .2X.2X.1cm, dry dark red and dry.  No odor or drainage.  Toe with hammerhead appearance; dry callous surrounding plantar wound. Right 4th toe with small partial thickness wound, .1X.1X.1cm, pink and dry, no odor or drainage. Dressing procedure/placement/frequency: Bactroban to promote moist healing to abd and toe wounds, cover with bandaids.  Discussed plan of care with patient and she denies further questions. Please re-consult if further assistance is needed.  Thank-you,  Cammie Mcgeeawn Kaiya Boatman MSN, RN, CWOCN, [place]CN-AP, CNS [phone]

## 2014-07-03 NOTE — Progress Notes (Signed)
PT Cancellation Note  Patient Details Name: Bridget MinerDarlene Ardolino MRN: [id number] DOB: 06/08/1945   Cancelled Treatment:    Reason Eval/Treat Not Completed: Patient declined, no reason specified (pt continues to eat lunch slowly reportedly due to gum pain and denied evaluation at this time. Will attempt next date)   Delorse Lekabor, Nura Cahoon Beth 07/03/2014, 1:23 PM Delaney MeigsMaija Tabor Keri Tavella, PT [phone]

## 2014-07-03 NOTE — H&P (Addendum)
[facility] Admission History and Physical       Bridget MinerDarlene Tetro ZOX:[id number] DOB: Jun 19, 1945 DOA: 07/02/2014  Referring physician:  PCP: No primary care provider on file.  Specialists:   Chief Complaint: Weakness and Falls  HPI: Bridget Austin is a 69 y.o. female with a history of DM2, HN, and Diabetic Neuropathy who presents to the ED after 2 falls over the past 3 days.  She was found on the floor and had been on the floor for several hours.   She reports falling due to weakness in her legs , and denies any syncope or chest pain.      Review of Systems:  Constitutional: No Weight Loss, No Weight Gain, Night Sweats, Fevers, Chills, Dizziness, Light Headedness, Fatigue, +Generalized Weakness HEENT: No Headaches, Difficulty Swallowing,Tooth/Dental Problems,Sore Throat,  No Sneezing, Rhinitis, Ear Ache, Nasal Congestion, or Post Nasal Drip,  Cardio-vascular:  No Chest pain, Orthopnea, PND, Edema in Lower Extremities, Anasarca, Dizziness, Palpitations  Resp: No Dyspnea, No DOE, No Productive Cough, No Non-Productive Cough, No Hemoptysis, No Wheezing.    GI: No Heartburn, Indigestion, Abdominal Pain, Nausea, Vomiting, Diarrhea, Constipation, Hematemesis, Hematochezia, Melena, Change in Bowel Habits,  Loss of Appetite  GU: No Dysuria, No Change in Color of Urine, No Urgency or Urinary Frequency, No Flank pain.  Musculoskeletal: No Joint Pain or Swelling, No Decreased Range of Motion, No Back Pain.  Neurologic: No Syncope, No Seizures, +Muscle Weakness in both Legs, Paresthesia, Vision Disturbance or Loss, No Diplopia, No Vertigo, No Difficulty Walking,  Skin: No Rash or Lesions. Psych: No Change in Mood or Affect, No Depression or Anxiety, No Memory loss, No Confusion, or Hallucinations   Past Medical History  Diagnosis Date  . Diabetes mellitus without complication   . Hypertension   . Neuropathy      History reviewed. No pertinent past surgical history.    Prior to  Admission medications   Not on File     No Known Allergies  Social History:  Lives Alone, Smokes 5 cigarettes a day, Non-Drinker, and No Illicit Drug Usage.       History reviewed. No pertinent family history.     Physical Exam:  GEN:  Pleasant Elderly Obese  69 y.o. Caucasian female examined and in no acute distress; cooperative with exam Filed Vitals:   07/03/14 0015 07/03/14 0030 07/03/14 0100 07/03/14 0546  BP: 129/49 153/54 162/78 147/63  Pulse: 80 83 86 71  Temp:   97.5 F (36.4 C) 98.5 F (36.9 C)  TempSrc:   Oral Oral  Resp: 13 21 20 16   Height:   5\' 6"  (1.676 m)   Weight:   97.478 kg (214 lb 14.4 oz)   SpO2: 100% 97% 96% 97%   Blood pressure 147/63, pulse 71, temperature 98.5 F (36.9 C), temperature source Oral, resp. rate 16, height 5\' 6"  (1.676 m), weight 97.478 kg (214 lb 14.4 oz), SpO2 97 %. PSYCH: She is alert and oriented x4; does not appear anxious does not appear depressed; affect is normal HEENT: Normocephalic and Atraumatic, Mucous membranes pink; PERRLA; EOM intact; Fundi:  Benign;  No scleral icterus, Nares: Patent, Oropharynx: Clear, Edentulous with Dentures,    Neck:  FROM, No Cervical Lymphadenopathy nor Thyromegaly or Carotid Bruit; No JVD; Breasts:: Not examined CHEST WALL: No tenderness CHEST: Normal respiration, clear to auscultation bilaterally HEART: Regular rate and rhythm; no murmurs rubs or gallops BACK: No kyphosis or scoliosis; No CVA tenderness ABDOMEN: Positive Bowel Sounds, Obese, Soft Non-Tender, No Rebound  or Guarding; No Masses, No Organomegaly, + Superficial ulcers   X 3 and 1 Bullous Area on left lower ABD.    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  No Cyanosis, Clubbing, or Edema; +Ulcerations on the Left Foot at the plantar surface and tips of the 2nd and 4th toes.   Renetta Chalk: not examined PULSES: 2+ and symmetric SKIN: Normal hydration no rash or ulceration CNS:  Alert and  Oriented x 4, No Focal Deficits,   Gait not Assessed Vascular: pulses palpable throughout    Labs on Admission:  Basic Metabolic Panel:  Recent Labs Lab 07/02/14 2205  NA 138  K 4.1  CL 102  CO2 24  GLUCOSE 206*  BUN 33*  CREATININE 1.46*  CALCIUM 9.7   Liver Function Tests:  Recent Labs Lab 07/02/14 2205  AST 62*  ALT 40*  ALKPHOS 71  BILITOT 0.9  PROT 7.5  ALBUMIN 3.5   No results for input(s): LIPASE, AMYLASE in the last 168 hours. No results for input(s): AMMONIA in the last 168 hours. CBC:  Recent Labs Lab 07/02/14 2205  WBC 10.4  NEUTROABS 8.0*  HGB 12.1  HCT 36.1  MCV 90.7  PLT 182   Cardiac Enzymes:  Recent Labs Lab 07/02/14 2205  CKTOTAL 2449*    BNP (last 3 results) No results for input(s): BNP in the last 8760 hours.  ProBNP (last 3 results) No results for input(s): PROBNP in the last 8760 hours.  CBG: No results for input(s): GLUCAP in the last 168 hours.  Radiological Exams on Admission: Dg Chest 2 View  07/02/2014   CLINICAL DATA:  Larey Seat twice today.  Weakness.  EXAM: CHEST  2 VIEW  COMPARISON:  None.  FINDINGS: Lungs are adequately inflated without consolidation or effusion. Cardiomediastinal silhouette is within normal. Mild calcified plaque over the thoracic aorta. There are mild degenerative changes of the spine. No fracture.  IMPRESSION: No active cardiopulmonary disease.   Electronically Signed   By: Elberta Fortis M.D.   On: 07/02/2014 22:08   Ct Head Wo Contrast  07/02/2014   CLINICAL DATA:  Bilateral leg weakness for 2 months. Two falls today. Initial encounter.  EXAM: CT HEAD WITHOUT CONTRAST  TECHNIQUE: Contiguous axial images were obtained from the base of the skull through the vertex without intravenous contrast.  COMPARISON:  None.  FINDINGS: There is no evidence of acute infarction, mass lesion, or intra- or extra-axial hemorrhage on CT.  Prominence of the ventricles and sulci suggests mild cortical volume loss. Mild  periventricular and subcortical white matter change likely reflects small vessel ischemic microangiopathy.  The brainstem and fourth ventricle are within normal limits. The basal ganglia are unremarkable in appearance. The cerebral hemispheres demonstrate grossly normal gray-white differentiation. No mass effect or midline shift is seen.  There is no evidence of fracture; visualized osseous structures are unremarkable in appearance. The orbits are within normal limits. The paranasal sinuses and mastoid air cells are well-aerated. No significant soft tissue abnormalities are seen.  IMPRESSION: 1. No acute intracranial pathology seen on CT. 2. Mild cortical volume loss and scattered small vessel ischemic microangiopathy.   Electronically Signed   By: Roanna Raider M.D.   On: 07/02/2014 21:59     EKG: Independently reviewed. Normal Sinus Rhythm rate =80 Old Antero-Septal Infarct changes, No Acute Changes.        Assessment/Plan:   69 y.o. female with  Principal Problem:   1.  Rhabdomyolysis   Gentle IVFs   Monitor CPK levels  Active Problems:   2.   UTI (lower urinary tract infection)   Urine C+S sent   IV Rocephin, adjust Abxs PRN Culture Results     3.   Weakness- due to #2, and #3,    Physical Therapy Consult requested       4.   Dehydration   IVFs   Monitor BUN/Cr     5.   Diabetes mellitus without complication   SSI coverage PRN   [facility] Home medications     6.   Hypertension   IV Hydaralzine PRn   Monitor BPs     7.   Neuropathy   Chronic due to diabetic Disease     8.   Pressure Ulcers on Left Lower ABD   Wound care eval for Rx     9.   Diabetic Foot Ulcers on Right 2nd and  4th toes   Wound Care Consult     8.   DVT Prophylaxis   Lovenox          Code Status:     FULL CODE        Family Communication:   Daughter at Bedside    Disposition Plan:    Inpatient  Status        Time spent:  89 Minutes      Ron Parker [facility] Pager  [phone]   If 7AM -7PM Please Contact the Day Rounding Team MD for [facility]  If 7PM-7AM, Please Contact Night-Floor Coverage  [email or web address] Password [email or web address] 07/03/2014, 8:25 AM     ADDENDUM:   Patient was seen and examined on 07/03/2014

## 2014-07-04 DIAGNOSIS — N390 Urinary tract infection, site not specified: Secondary | ICD-10-CM | POA: Diagnosis present

## 2014-07-04 DIAGNOSIS — E1142 Type 2 diabetes mellitus with diabetic polyneuropathy: Secondary | ICD-10-CM | POA: Diagnosis present

## 2014-07-04 DIAGNOSIS — N179 Acute kidney failure, unspecified: Secondary | ICD-10-CM | POA: Diagnosis present

## 2014-07-04 DIAGNOSIS — T501X5A Adverse effect of loop [high-ceiling] diuretics, initial encounter: Secondary | ICD-10-CM | POA: Diagnosis present

## 2014-07-04 DIAGNOSIS — I10 Essential (primary) hypertension: Secondary | ICD-10-CM | POA: Diagnosis present

## 2014-07-04 DIAGNOSIS — M6282 Rhabdomyolysis: Secondary | ICD-10-CM | POA: Diagnosis present

## 2014-07-04 DIAGNOSIS — E119 Type 2 diabetes mellitus without complications: Secondary | ICD-10-CM | POA: Diagnosis not present

## 2014-07-04 DIAGNOSIS — T464X5A Adverse effect of angiotensin-converting-enzyme inhibitors, initial encounter: Secondary | ICD-10-CM | POA: Diagnosis present

## 2014-07-04 DIAGNOSIS — Z89422 Acquired absence of other left toe(s): Secondary | ICD-10-CM | POA: Diagnosis not present

## 2014-07-04 DIAGNOSIS — R32 Unspecified urinary incontinence: Secondary | ICD-10-CM | POA: Diagnosis present

## 2014-07-04 DIAGNOSIS — D638 Anemia in other chronic diseases classified elsewhere: Secondary | ICD-10-CM | POA: Diagnosis present

## 2014-07-04 DIAGNOSIS — E860 Dehydration: Secondary | ICD-10-CM | POA: Diagnosis present

## 2014-07-04 DIAGNOSIS — W19XXXA Unspecified fall, initial encounter: Secondary | ICD-10-CM | POA: Diagnosis present

## 2014-07-04 DIAGNOSIS — F17210 Nicotine dependence, cigarettes, uncomplicated: Secondary | ICD-10-CM | POA: Diagnosis present

## 2014-07-04 LAB — CK: CK TOTAL: 651 U/L — AB (ref 7–177)

## 2014-07-04 LAB — BASIC METABOLIC PANEL
Anion gap: 7 (ref 5–15)
BUN: 28 mg/dL — AB (ref 6–23)
CALCIUM: 8.5 mg/dL (ref 8.4–10.5)
CO2: 24 mmol/L (ref 19–32)
CREATININE: 1.29 mg/dL — AB (ref 0.50–1.10)
Chloride: 107 mmol/L (ref 96–112)
GFR, EST AFRICAN AMERICAN: 48 mL/min — AB (ref >90)
GFR, EST NON AFRICAN AMERICAN: 42 mL/min — AB (ref >90)
Glucose, Bld: 106 mg/dL — ABNORMAL HIGH (ref 70–99)
Potassium: 3.6 mmol/L (ref 3.5–5.1)
Sodium: 138 mmol/L (ref 135–145)

## 2014-07-04 LAB — CBC
HEMATOCRIT: 29.7 % — AB (ref 36.0–46.0)
Hemoglobin: 9.5 g/dL — ABNORMAL LOW (ref 12.0–15.0)
MCH: 29.8 pg (ref 26.0–34.0)
MCHC: 32 g/dL (ref 30.0–36.0)
MCV: 93.1 fL (ref 78.0–100.0)
PLATELETS: 163 10*3/uL (ref 150–400)
RBC: 3.19 MIL/uL — ABNORMAL LOW (ref 3.87–5.11)
RDW: 12.8 % (ref 11.5–15.5)
WBC: 8.6 10*3/uL (ref 4.0–10.5)

## 2014-07-04 LAB — GLUCOSE, CAPILLARY
GLUCOSE-CAPILLARY: 108 mg/dL — AB (ref 70–99)
GLUCOSE-CAPILLARY: 180 mg/dL — AB (ref 70–99)
Glucose-Capillary: 170 mg/dL — ABNORMAL HIGH (ref 70–99)
Glucose-Capillary: 233 mg/dL — ABNORMAL HIGH (ref 70–99)

## 2014-07-04 NOTE — Progress Notes (Signed)
UR completed 

## 2014-07-04 NOTE — Progress Notes (Signed)
PATIENT DETAILS Name: Bridget MinerDarlene Austin Age: 69 y.o. Sex: female Date of Birth: 09/06/1945 Admit Date: 07/02/2014 Admitting Physician Ron ParkerHarvette C Jenkins, MD PCP:No primary care provider on file.  Brief narrative:  Patient is a 69 year old female with history of diabetes with peripheral neuropathy, hypertension who presented to the hospital with several days of weakness, fall. She is found to have mild rhabdomyolysis, dehydration and UTI. She was admitted and started on IV Rocephin, IV fluids. Clinically improved. Culture data is pending.  Subjective: Feels much better  Assessment/Plan: Principal Problem:   Rhabdomyolysis: Secondary to prolonged immobilization/being on the floor. CPK significantly decreased with IV fluids. Saline lock all fluids. Follow.   Active Problems:   UTI (lower urinary tract infection): Continue with IV Rocephin. Urine culture still pending, blood cultures negative. Afebrile and without any leukocytosis. Suspect needs one additional day of IV Rocephin before we transition to oral antibiotics depending on culture data    Dehydration: Secondary to above. Hydrated with IV fluids on admission. Euvolemic on exam, have discontinued all IV fluids. Encourage fluid intake orally, and reassess any.    Acute renal failure: Likely secondary to prerenal azotemia from above and from lisinopril/Lasix. Creatinine significantly improved. Continue to hold lisinopril and Lasix for now.     Generalized Weakness: Likely secondary to dehydration and UTI. CT head negative for acute changes. Nonfocal exam. PT eval completed, recommendations are for home health PT with 24-hour supervision.    Diabetes mellitus without complication: CBGs with moderate control-continue glipizide, and SSI. Resume rest of her medications on discharge.A1c pending.    Hypertension: Blood pressure with moderate control-currently off all antihypertensive medication, continue to monitor off all  antihypertensive medications-suspect if we can resume some of her oral antihypertensives on discharge.    Anemia: Likely secondary to chronic disease. Further workup/monitoring deferred to the outpatient setting    Neuropathy: resume Neurontin  Disposition: Remain inpatient-suspect home with home health services on 3/16 if clinical improvement continues  Antibiotics:  See below   Anti-infectives    Start     Dose/Rate Route Frequency Ordered Stop   07/03/14 2359  cefTRIAXone (ROCEPHIN) 1 g in dextrose 5 % 50 mL IVPB - Premix  Status:  Discontinued     1 g 100 mL/hr over 30 Minutes Intravenous Every 24 hours 07/03/14 0826 07/03/14 0833   07/03/14 2300  cefTRIAXone (ROCEPHIN) 1 g in dextrose 5 % 50 mL IVPB - Premix     1 g 100 mL/hr over 30 Minutes Intravenous Every 24 hours 07/03/14 0822     07/03/14 0000  cefTRIAXone (ROCEPHIN) 1 g in dextrose 5 % 50 mL IVPB     1 g 100 mL/hr over 30 Minutes Intravenous  Once 07/02/14 2350 07/03/14 0030      DVT Prophylaxis: Prophylactic Heparin   Code Status: Full code   Family Communication None at bedside  Procedures:  None  CONSULTS:  None  MEDICATIONS: Scheduled Meds: . aspirin  81 mg Oral Daily  . cefTRIAXone (ROCEPHIN)  IV  1 g Intravenous Q24H  . gabapentin  400 mg Oral TID  . glipiZIDE  10 mg Oral BID AC  . heparin  5,000 Units Subcutaneous 3 times per day  . insulin aspart  0-5 Units Subcutaneous QHS  . insulin aspart  0-9 Units Subcutaneous TID WC  . mupirocin ointment   Topical Daily   Continuous Infusions:   PRN Meds:.acetaminophen **OR** acetaminophen, albuterol, alum & mag hydroxide-simeth, guaiFENesin-dextromethorphan, hydrALAZINE,  HYDROmorphone (DILAUDID) injection, ondansetron **OR** ondansetron (ZOFRAN) IV, oxyCODONE    PHYSICAL EXAM: Vital signs in last 24 hours: Filed Vitals:   07/03/14 1732 07/03/14 2207 07/04/14 0241 07/04/14 0604  BP: 150/63 155/62 123/59 151/61  Pulse: 77 74 72 73  Temp: 98  F (36.7 C) 98.7 F (37.1 C) 99 F (37.2 C) 98.2 F (36.8 C)  TempSrc: Axillary Oral Oral Oral  Resp: Height:      Weight:      SpO2: 99% 99% 96% 98%    Weight change:  Filed Weights   07/03/14 0100  Weight: 97.478 kg (214 lb 14.4 oz)   Body mass index is 34.7 kg/(m^2).   Gen Exam: Awake and alert with clear speech.   Neck: Supple, No JVD.   Chest: B/L Clear.   CVS: S1 S2 Regular, no murmurs.  Abdomen: soft, BS +, non tender, non distended.  Extremities: no edema, lower extremities warm to touch. Neurologic: Non Focal.   Skin: No Rash.   Wounds: N/A.    Intake/Output from previous day:  Intake/Output Summary (Last 24 hours) at 07/04/14 1008 Last data filed at 07/03/14 2217  Gross per 24 hour  Intake    240 ml  Output      0 ml  Net    240 ml     LAB RESULTS: CBC  Recent Labs Lab 07/02/14 2205 07/03/14 1004 07/04/14 0604  WBC 10.4 9.4 8.6  HGB 12.1 10.9* 9.5*  HCT 36.1 33.7* 29.7*  PLT 182 169 163  MCV 90.7 93.6 93.1  MCH 30.4 30.3 29.8  MCHC 33.5 32.3 32.0  RDW 12.3 12.7 12.8  LYMPHSABS 1.6  --   --   MONOABS 0.6  --   --   EOSABS 0.1  --   --   BASOSABS 0.0  --   --     Chemistries   Recent Labs Lab 07/02/14 2205 07/03/14 1004 07/04/14 0604  NA 138  --  138  K 4.1  --  3.6  CL 102  --  107  CO2 24  --  24  GLUCOSE 206*  --  106*  BUN 33*  --  28*  CREATININE 1.46* 1.51* 1.29*  CALCIUM 9.7  --  8.5    CBG:  Recent Labs Lab 07/03/14 0841 07/03/14 1152 07/03/14 1620 07/03/14 2217 07/04/14 0633  GLUCAP 165* 234* 192* 158* 108*    GFR Estimated Creatinine Clearance: 49.2 mL/min (by C-G formula based on Cr of 1.29).  Coagulation profile No results for input(s): INR, PROTIME in the last 168 hours.  Cardiac Enzymes No results for input(s): CKMB, TROPONINI, MYOGLOBIN in the last 168 hours.  Invalid input(s): CK  Invalid input(s): POCBNP No results for input(s): DDIMER in the last 72 hours. No results for  input(s): HGBA1C in the last 72 hours. No results for input(s): CHOL, HDL, LDLCALC, TRIG, CHOLHDL, LDLDIRECT in the last 72 hours.  Recent Labs  07/03/14 0010  TSH 0.491   No results for input(s): VITAMINB12, FOLATE, FERRITIN, TIBC, IRON, RETICCTPCT in the last 72 hours. No results for input(s): LIPASE, AMYLASE in the last 72 hours.  Urine Studies No results for input(s): UHGB, CRYS in the last 72 hours.  Invalid input(s): UACOL, UAPR, USPG, UPH, UTP, UGL, UKET, UBIL, UNIT, UROB, ULEU, UEPI, UWBC, URBC, UBAC, CAST, UCOM, BILUA  MICROBIOLOGY: Recent Results (from the past 240 hour(s))  Culture, blood (routine x 2)     Status: None (  Preliminary result)   Collection Time: 07/03/14 10:04 AM  Result Value Ref Range Status   Specimen Description BLOOD LEFT ANTECUBITAL  Final   Special Requests BOTTLES DRAWN AEROBIC AND ANAEROBIC 10CC  Final   Culture   Final           BLOOD CULTURE RECEIVED NO GROWTH TO DATE CULTURE WILL BE HELD FOR 5 DAYS BEFORE ISSUING A FINAL NEGATIVE REPORT Performed at [facility]    Report Status PENDING  Incomplete  Culture, blood (routine x 2)     Status: None (Preliminary result)   Collection Time: 07/03/14 10:23 AM  Result Value Ref Range Status   Specimen Description BLOOD LEFT HAND  Final   Special Requests BOTTLES DRAWN AEROBIC AND ANAEROBIC 10CC  Final   Culture   Final           BLOOD CULTURE RECEIVED NO GROWTH TO DATE CULTURE WILL BE HELD FOR 5 DAYS BEFORE ISSUING A FINAL NEGATIVE REPORT Performed at [facility]    Report Status PENDING  Incomplete    RADIOLOGY STUDIES/RESULTS: Dg Chest 2 View  07/02/2014   CLINICAL DATA:  Larey Seat twice today.  Weakness.  EXAM: CHEST  2 VIEW  COMPARISON:  None.  FINDINGS: Lungs are adequately inflated without consolidation or effusion. Cardiomediastinal silhouette is within normal. Mild calcified plaque over the thoracic aorta. There are mild degenerative changes of the spine. No fracture.   IMPRESSION: No active cardiopulmonary disease.   Electronically Signed   By: Elberta Fortis M.D.   On: 07/02/2014 22:08   Ct Head Wo Contrast  07/02/2014   CLINICAL DATA:  Bilateral leg weakness for 2 months. Two falls today. Initial encounter.  EXAM: CT HEAD WITHOUT CONTRAST  TECHNIQUE: Contiguous axial images were obtained from the base of the skull through the vertex without intravenous contrast.  COMPARISON:  None.  FINDINGS: There is no evidence of acute infarction, mass lesion, or intra- or extra-axial hemorrhage on CT.  Prominence of the ventricles and sulci suggests mild cortical volume loss. Mild periventricular and subcortical white matter change likely reflects small vessel ischemic microangiopathy.  The brainstem and fourth ventricle are within normal limits. The basal ganglia are unremarkable in appearance. The cerebral hemispheres demonstrate grossly normal gray-white differentiation. No mass effect or midline shift is seen.  There is no evidence of fracture; visualized osseous structures are unremarkable in appearance. The orbits are within normal limits. The paranasal sinuses and mastoid air cells are well-aerated. No significant soft tissue abnormalities are seen.  IMPRESSION: 1. No acute intracranial pathology seen on CT. 2. Mild cortical volume loss and scattered small vessel ischemic microangiopathy.   Electronically Signed   By: Roanna Raider M.D.   On: 07/02/2014 21:59    Jeoffrey Massed, MD  [facility] Pager:[phone]  If 7PM-7AM, please contact night-coverage [email or web address] Password [email or web address] 07/04/2014, 10:08 AM   LOS: 1 day

## 2014-07-04 NOTE — Care Management Note (Addendum)
    Page 1 of 1   07/05/2014     10:56:14 AM CARE MANAGEMENT NOTE 07/05/2014  Patient:  Bridget Austin, Bridget Austin   Account Number:  [id number]  Date Initiated:  07/04/2014  Documentation initiated by:  Lorne Skeens  Subjective/Objective Assessment:   Patient was admitted with UTI, rhabdomylosis.     Action/Plan:   Will follow for discharge needs pending PT/OT evals and physician orders.   Anticipated DC Date:  07/05/2014   Anticipated DC Plan:  [place]  CM consult      Choice offered to / List presented to:             Status of service:  Completed, signed off Medicare Important Message given?  YES (If response is "NO", the following Medicare IM given date fields will be blank) Date Medicare IM given:  07/05/2014 Medicare IM given by:  Lorne Skeens Date Additional Medicare IM given:   Additional Medicare IM given by:    Discharge Disposition:  HOME/SELF CARE  Per UR Regulation:  Reviewed for med. necessity/level of care/duration of stay  If discussed at [place] of Stay Meetings, dates discussed:    Comments:  07/05/15 [place], MSN, CM- Order noted for rolling walker. Met with patient, who states that she already has one at home. Patient continues to decline home health services at this time.  Bedside RN and attending MD aware.   07/04/14 [place], MSN, CM- Met with patient to discuss home health. Patient is currently declining [place] services. CM encouraged patient to notify her PCP Dr Gena Fray of [facility] if any needs arise after discharge.

## 2014-07-04 NOTE — Evaluation (Signed)
Physical Therapy Evaluation Patient Details Name: Bridget Austin MRN: [id number] DOB: 09-20-1945 Today's Date: 07/04/2014   History of Present Illness  Adm s/p several falls with several hours on floor. + rhabdo, UTI, dehydration PMHx- DM, neuropathy, pt reports multiple Lt leg fxs in past with leg length discrepancy (uses heel lift in shoe)  Clinical Impression  Pt admitted with above diagnosis. Pt currently a high fall risk (scored 31/56 on Berg Balance test). Lives alone, however thinks she can stay with her sister (does not want to). Pt currently with functional limitations due to the deficits listed below (see PT Problem List).  Pt will benefit from skilled PT to increase their independence and safety with mobility to allow discharge to the venue listed below.       Follow Up Recommendations Home health PT;Supervision/Assistance - 24 hour (without 24 hour supervision/assist, would recommend SNF and highly doubt she would agree)    Equipment Recommendations  Rolling walker with 5" wheels    Recommendations for Other Services OT consult     Precautions / Restrictions Precautions Precautions: Fall Restrictions Weight Bearing Restrictions: No      Mobility  Bed Mobility Overal bed mobility: Needs Assistance Bed Mobility: Rolling;Sidelying to Sit Rolling: Supervision Sidelying to sit: Min assist       General bed mobility comments: HOB 0, no rail; multiple cues to stay on task; pt used edge of mattress to assist with rolling; required assist to initiate raising torso due to multiple failed attempts due to weakness  Transfers Overall transfer level: Needs assistance Equipment used: Rolling walker (2 wheeled);None Transfers: Sit to/from Stand Sit to Stand: Min guard         General transfer comment: vc for safe use of RW; slight unsteady without RW; repeated x 4  Ambulation/Gait Ambulation/Gait assistance: Min assist Ambulation Distance (Feet): 30 Feet (requested  seated rest "i don't know what's tired" 60 ft) Assistive device: Rolling walker (2 wheeled);None Gait Pattern/deviations: Step-through pattern;Decreased stride length;Decreased weight shift to right;Trendelenburg;Drifts right/left Gait velocity: decr Gait velocity interpretation: Below normal speed for age/gender General Gait Details: poor maneuvering around door/doorframe into/out of bathroom; without RW drifting to her Rt  Stairs            Wheelchair Mobility    Modified Rankin (Stroke Patients Only)       Balance Overall balance assessment: Needs assistance;History of Falls Sitting-balance support: No upper extremity supported;Feet supported Sitting balance-Leahy Scale: Fair     Standing balance support: No upper extremity supported Standing balance-Leahy Scale: Fair   Single Leg Stance - Right Leg: 1 Single Leg Stance - Left Leg: 1 Tandem Stance - Right Leg: 0 (unable)   Rhomberg - Eyes Opened: 10 (slow lean then loss of balance to her left) Rhomberg - Eyes Closed:  (feet shoulder width, eyes closed with incr sway)     Standardized Balance Assessment Standardized Balance Assessment : Berg Balance Test Berg Balance Test Sit to Stand: Able to stand  independently using hands Standing Unsupported: Able to stand 30 seconds unsupported Sitting with Back Unsupported but Feet Supported on Floor or Stool: Able to sit safely and securely 2 minutes Stand to Sit: Controls descent by using hands Transfers: Able to transfer with verbal cueing and /or supervision Standing Unsupported with Eyes Closed: Able to stand 10 seconds with supervision Standing Ubsupported with Feet Together: Needs help to attain position and unable to hold for 15 seconds From Standing, Reach Forward with Outstretched Arm: Can reach confidently >25 cm (  10") From Standing Position, Pick up Object from Floor: Able to pick up shoe, needs supervision From Standing Position, Turn to Look Behind Over each  Shoulder: Turn sideways only but maintains balance Turn 360 Degrees: Needs close supervision or verbal cueing Standing Unsupported, Alternately Place Feet on Step/Stool: Able to complete >2 steps/needs minimal assist Standing Unsupported, One Foot in Front: Able to take small step independently and hold 30 seconds Standing on One Leg: Tries to lift leg/unable to hold 3 seconds but remains standing independently Total Score: 31         Pertinent Vitals/Pain Pain Assessment: No/denies pain    Home Living Family/patient expects to be discharged to:: Private residence Living Arrangements: Alone Available Help at Discharge: Family;Available PRN/intermittently (niece) Type of Home: House Home Access: Stairs to enter Entrance Stairs-Rails: Doctor, general practice of Steps: 3 Home Layout: One level Home Equipment: Walker - 2 wheels;Cane - single point;Shower seat - built in (comfort height toilet)      Prior Function Level of Independence: Independent         Comments: frequent falls "off balance" per pt     Hand Dominance   Dominant Hand: Right    Extremity/Trunk Assessment   Upper Extremity Assessment: Overall WFL for tasks assessed           Lower Extremity Assessment: LLE deficits/detail (Rt 5/5; bil ankles intact proprioception; light touch intact)   LLE Deficits / Details: knee extension 4/5, ankle DF 5/5; leg shorter than Rt; 1st, 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  Cervical / Trunk Assessment: Other exceptions  Communication   Communication: No difficulties  Cognition Arousal/Alertness: Awake/alert Behavior During Therapy: Flat affect Overall Cognitive Status: No family/caregiver present to determine baseline cognitive functioning Area of Impairment: Attention;Safety/judgement;Awareness;Problem solving;Orientation Orientation Level: Time Current Attention Level: Sustained (?internally distracted) Memory: Decreased short-term memory ("I don't know" was frequent  answer re: prior stats)   Safety/Judgement: Decreased awareness of deficits;Decreased awareness of safety Awareness: Intellectual Problem Solving: Slow processing;Decreased initiation;Requires verbal cues (very slow processing to get OOB; multiple cues incr time) General Comments: pt reports her thinking is "back to normal" unsure of her baseline    General Comments      Exercises        Assessment/Plan    PT Assessment Patient needs continued PT services  PT Diagnosis Difficulty walking;Altered mental status   PT Problem List Decreased strength;Decreased activity tolerance;Decreased balance;Decreased mobility;Decreased cognition;Decreased knowledge of use of DME;Decreased safety awareness;Impaired sensation;Obesity  PT Treatment Interventions DME instruction;Gait training;Stair training;Functional mobility training;Therapeutic activities;Balance training;Cognitive remediation;Patient/family education   PT Goals (Current goals can be found in the Care Plan section) Acute Rehab PT Goals Patient Stated Goal: return home today PT Goal Formulation: With patient Time For Goal Achievement: 07/11/14 Potential to Achieve Goals: Good    Frequency Min 3X/week   Barriers to discharge Decreased caregiver support ? sister can stay with her    Co-evaluation               End of Session Equipment Utilized During Treatment: Gait belt Activity Tolerance: Patient limited by fatigue (requested seated rest x 2) Patient left: in chair;with call bell/phone within reach;with chair alarm set Nurse Communication: Mobility status    Functional Assessment Tool Used: clinical observation Functional Limitation: Mobility: Walking and moving around Mobility: Walking and Moving Around Current Status [phone]): At least 1 percent but less than 20 percent impaired, limited or restricted Mobility: Walking and Moving Around Goal Status [phone]): 0 percent impaired, limited or restricted  Time:  0827-0912 PT Time Calculation (min) (ACUTE ONLY): 45 min   Charges:   PT Evaluation $Initial PT Evaluation Tier I: 1 Procedure PT Treatments $Gait Training: 8-22 mins $Therapeutic Activity: 8-22 mins   PT G Codes:   PT G-Codes **NOT FOR INPATIENT CLASS** Functional Assessment Tool Used: clinical observation Functional Limitation: Mobility: Walking and moving around Mobility: Walking and Moving Around Current Status ([id number]): At least 1 percent but less than 20 percent impaired, limited or restricted Mobility: Walking and Moving Around Goal Status [phone]([id number]): 0 percent impaired, limited or restricted    Sherah Lund 07/04/2014, 9:34 AM  Pager [phone]

## 2014-07-05 DIAGNOSIS — E860 Dehydration: Secondary | ICD-10-CM

## 2014-07-05 LAB — URINE CULTURE: Colony Count: >=100000

## 2014-07-05 LAB — BASIC METABOLIC PANEL
Anion gap: 5 (ref 5–15)
BUN: 26 mg/dL — ABNORMAL HIGH (ref 6–23)
CO2: 25 mmol/L (ref 19–32)
Calcium: 9.4 mg/dL (ref 8.4–10.5)
Chloride: 107 mmol/L (ref 96–112)
Creatinine, Ser: 1.3 mg/dL — ABNORMAL HIGH (ref 0.50–1.10)
GFR calc Af Amer: 48 mL/min — ABNORMAL LOW (ref >90)
GFR, EST NON AFRICAN AMERICAN: 41 mL/min — AB (ref >90)
Glucose, Bld: 140 mg/dL — ABNORMAL HIGH (ref 70–99)
POTASSIUM: 3.8 mmol/L (ref 3.5–5.1)
Sodium: 137 mmol/L (ref 135–145)

## 2014-07-05 LAB — HEMOGLOBIN A1C
Hgb A1c MFr Bld: 8.5 % — ABNORMAL HIGH (ref 4.8–5.6)
Mean Plasma Glucose: 197 mg/dL

## 2014-07-05 LAB — GLUCOSE, CAPILLARY
Glucose-Capillary: 125 mg/dL — ABNORMAL HIGH (ref 70–99)
Glucose-Capillary: 175 mg/dL — ABNORMAL HIGH (ref 70–99)

## 2014-07-05 MED ORDER — CEPHALEXIN 500 MG PO CAPS: 500.0000 mg | ORAL_CAPSULE | Freq: Two times a day (BID) | ORAL | Status: AC

## 2014-07-05 MED ORDER — FUROSEMIDE 20 MG PO TABS: 20.0000 mg | ORAL_TABLET | Freq: Every day | ORAL | Status: AC

## 2014-07-05 MED ORDER — DOXYCYCLINE HYCLATE 50 MG PO CAPS: 100.0000 mg | ORAL_CAPSULE | Freq: Two times a day (BID) | ORAL | Status: AC

## 2014-07-05 MED ORDER — GLIPIZIDE 10 MG PO TABS: 10.0000 mg | ORAL_TABLET | Freq: Two times a day (BID) | ORAL | Status: AC

## 2014-07-05 MED ORDER — GABAPENTIN 400 MG PO CAPS: 400.0000 mg | ORAL_CAPSULE | Freq: Three times a day (TID) | ORAL | Status: AC

## 2014-07-05 MED ORDER — LISINOPRIL 10 MG PO TABS
10.0000 mg | ORAL_TABLET | Freq: Every day | ORAL | Status: AC
Start: 2014-07-11 — End: ?

## 2014-07-05 NOTE — Progress Notes (Signed)
Patient DC's home via car with sister.  DC instructions and prescription information given to patient and fully understood.  Vital signs and assessments were stable.

## 2014-07-05 NOTE — Progress Notes (Signed)
Physical Therapy Treatment Patient Details Name: Bridget Austin MRN: [id number] DOB: 1945/08/13 Today's Date: 07/05/2014    History of Present Illness Adm s/p several falls with several hours on floor. + rhabdo, UTI, dehydration PMHx- DM, neuropathy, pt reports multiple Lt leg fxs in past with leg length discrepancy (uses heel lift in shoe)    PT Comments    Pt progressing well towards goals, is ambulating safely with RW at supervision level as well as ascending/ descending stairs and performing balance activities. Pt also shows improved insight into limitations compared to visit yesterday. PT will continue to follow.   Follow Up Recommendations  Home health PT;Supervision - Intermittent     Equipment Recommendations  Rolling walker with 5" wheels    Recommendations for Other Services OT consult     Precautions / Restrictions Precautions Precautions: Fall Restrictions Weight Bearing Restrictions: No    Mobility  Bed Mobility Overal bed mobility: Modified Independent Bed Mobility: Sit to Supine       Sit to supine: Modified independent (Device/Increase time)   General bed mobility comments: pt able to get into bed and scoot over/ up without physical assist  Transfers Overall transfer level: Modified independent Equipment used: Rolling walker (2 wheeled) Transfers: Sit to/from Stand Sit to Stand: Modified independent (Device/Increase time)         General transfer comment: steady with standing today and safe use of RW  Ambulation/Gait Ambulation/Gait assistance: Supervision Ambulation Distance (Feet): 200 Feet Assistive device: Rolling walker (2 wheeled) Gait Pattern/deviations: Step-through pattern Gait velocity: decr Gait velocity interpretation: Below normal speed for age/gender General Gait Details: pt safe with RW, navigating around obstacles safely. Practiced changing speeds with RW and walking bkwds. Educated her on using it consistently right now and  progressing off with HHPT   Stairs Stairs: Yes Stairs assistance: Supervision Stair Management: One rail Left;Step to pattern;Forwards Number of Stairs: 5 General stair comments: practiced while holding RW and without so that she can get her RW in and out of house. No problems with stairs  Wheelchair Mobility    Modified Rankin (Stroke Patients Only)       Balance Overall balance assessment: Needs assistance Sitting-balance support: No upper extremity supported;Feet supported Sitting balance-Leahy Scale: Good     Standing balance support: No upper extremity supported;During functional activity Standing balance-Leahy Scale: Good Standing balance comment: practiced dynamic standing balance, pt able to reach fwd with good posterior wt shift, no LOB with head turns, no LOB with narrowed BOS                    Cognition Arousal/Alertness: Awake/alert Behavior During Therapy: Flat affect Overall Cognitive Status: No family/caregiver present to determine baseline cognitive functioning     Current Attention Level: Selective     Safety/Judgement: Decreased awareness of safety Awareness: Emergent Problem Solving: Slow processing General Comments: pt's cognition improved today from last visit, shows much better insight into limitations, still with delayed response time verbally and physically and with mild safety awareness deficits, again question baseline status. Pt was appropriate throughout session and was perceiving challenges that she would have at home    Exercises      General Comments General comments (skin integrity, edema, etc.): Berg balance scale improved from 31 to 42, which is still significant risk for falls but pt imrpoving considerably within a day and expect continued improvement with HHPT      Pertinent Vitals/Pain Pain Assessment: No/denies pain    Home Living  Prior Function            PT Goals (current goals can  now be found in the care plan section) Acute Rehab PT Goals Patient Stated Goal: return home today PT Goal Formulation: With patient Time For Goal Achievement: 07/11/14 Potential to Achieve Goals: Good Progress towards PT goals: Progressing toward goals    Frequency  Min 3X/week    PT Plan Discharge plan needs to be updated    Co-evaluation             End of Session   Activity Tolerance: Patient tolerated treatment well Patient left: in bed;with call bell/phone within reach;with bed alarm set     Time: 1610-9604 PT Time Calculation (min) (ACUTE ONLY): 36 min  Charges:  $Gait Training: 23-37 mins                    G Codes:     Lyanne Co, PT  Acute Rehab Services  [phone]  Lyanne Co 07/05/2014, 12:15 PM

## 2014-07-05 NOTE — Discharge Summary (Signed)
Physician Discharge Summary  Bridget Austin MRN: [id number] DOB/AGE: 1945/08/30 69 y.o.  PCP: No primary care provider on file.   Admit date: 07/02/2014 Discharge date: 07/05/2014  Discharge Diagnoses:     Principal Problem:   Rhabdomyolysis Active Problems:   UTI (lower urinary tract infection)   Dehydration   Weakness   Diabetes mellitus without complication   Hypertension   Neuropathy  Follow-up recommendations Follow-up with PCP in 3-5 days Follow-up CBC, CMP in 3-5 days    Medication List    STOP taking these medications        gabapentin 800 MG tablet  Commonly known as:  NEURONTIN  Replaced by:  gabapentin 400 MG capsule     ibuprofen 200 MG tablet  Commonly known as:  ADVIL,MOTRIN      TAKE these medications        aspirin 81 MG tablet  Take 81 mg by mouth daily.     cephALEXin 500 MG capsule  Commonly known as:  KEFLEX  Take 1 capsule (500 mg total) by mouth 2 (two) times daily.     cetirizine 10 MG tablet  Commonly known as:  ZYRTEC  Take 10 mg by mouth daily.     [facility] CINNAMON PO  Take 2,000 mg by mouth daily. 1000 mg per capsule     doxycycline 50 MG capsule  Commonly known as:  VIBRAMYCIN  Take 2 capsules (100 mg total) by mouth 2 (two) times daily.     furosemide 20 MG tablet  Commonly known as:  LASIX  Take 1 tablet (20 mg total) by mouth daily.  Start taking on:  07/11/2014     gabapentin 400 MG capsule  Commonly known as:  NEURONTIN  Take 1 capsule (400 mg total) by mouth 3 (three) times daily.     glipiZIDE 10 MG tablet  Commonly known as:  GLUCOTROL  Take 1 tablet (10 mg total) by mouth 2 (two) times daily before a meal.     lisinopril 10 MG tablet  Commonly known as:  PRINIVIL,ZESTRIL  Take 1 tablet (10 mg total) by mouth daily.  Start taking on:  07/11/2014     oxyCODONE-acetaminophen 5-325 MG per tablet  Commonly known as:  PERCOCET/ROXICET  Take 1 tablet by mouth every 6 (six) hours as needed for severe pain (Back  pain).     pioglitazone 15 MG tablet  Commonly known as:  ACTOS  Take 15 mg by mouth daily.     sennosides-docusate sodium 8.6-50 MG tablet  Commonly known as:  SENOKOT-S  Take 1 tablet by mouth daily as needed for constipation.     sitaGLIPtin 50 MG tablet  Commonly known as:  JANUVIA  Take 50 mg by mouth daily.        Discharge Condition: Stable   Disposition:  refusing home health   Consults:     Significant Diagnostic Studies: Dg Chest 2 View  07/02/2014   CLINICAL DATA:  [place] twice today.  Weakness.  EXAM: CHEST  2 VIEW  COMPARISON:  None.  FINDINGS: Lungs are adequately inflated without consolidation or effusion. Cardiomediastinal silhouette is within normal. Mild calcified plaque over the thoracic aorta. There are mild degenerative changes of the spine. No fracture.  IMPRESSION: No active cardiopulmonary disease.   Electronically Signed   By: Marin Olp M.D.   On: 07/02/2014 22:08   Ct Head Wo Contrast  07/02/2014   CLINICAL DATA:  Bilateral leg weakness for 2 months. Two falls  today. Initial encounter.  EXAM: CT HEAD WITHOUT CONTRAST  TECHNIQUE: Contiguous axial images were obtained from the base of the skull through the vertex without intravenous contrast.  COMPARISON:  None.  FINDINGS: There is no evidence of acute infarction, mass lesion, or intra- or extra-axial hemorrhage on CT.  Prominence of the ventricles and sulci suggests mild cortical volume loss. Mild periventricular and subcortical white matter change likely reflects small vessel ischemic microangiopathy.  The brainstem and fourth ventricle are within normal limits. The basal ganglia are unremarkable in appearance. The cerebral hemispheres demonstrate grossly normal gray-white differentiation. No mass effect or midline shift is seen.  There is no evidence of fracture; visualized osseous structures are unremarkable in appearance. The orbits are within normal limits. The paranasal sinuses and mastoid air cells  are well-aerated. No significant soft tissue abnormalities are seen.  IMPRESSION: 1. No acute intracranial pathology seen on CT. 2. Mild cortical volume loss and scattered small vessel ischemic microangiopathy.   Electronically Signed   By: Garald Balding M.D.   On: 07/02/2014 21:59       Microbiology: Recent Results (from the past 240 hour(s))  Urine culture     Status: None   Collection Time: 07/02/14 11:18 PM  Result Value Ref Range Status   Specimen Description URINE, CLEAN CATCH  Final   Special Requests NONE  Final   Colony Count   Final    >=100,000 COLONIES/ML Performed at [facility]    Culture   Final    ESCHERICHIA COLI Performed at [facility]    Report Status 07/05/2014 FINAL  Final   Organism ID, Bacteria ESCHERICHIA COLI  Final      Susceptibility   Escherichia coli - MIC*    AMPICILLIN <=2 SENSITIVE Sensitive     CEFAZOLIN <=4 SENSITIVE Sensitive     CEFTRIAXONE <=1 SENSITIVE Sensitive     CIPROFLOXACIN <=0.25 SENSITIVE Sensitive     GENTAMICIN <=1 SENSITIVE Sensitive     LEVOFLOXACIN <=0.12 SENSITIVE Sensitive     NITROFURANTOIN <=16 SENSITIVE Sensitive     TOBRAMYCIN <=1 SENSITIVE Sensitive     TRIMETH/SULFA <=20 SENSITIVE Sensitive     PIP/TAZO <=4 SENSITIVE Sensitive     * ESCHERICHIA COLI  Culture, blood (routine x 2)     Status: None (Preliminary result)   Collection Time: 07/03/14 10:04 AM  Result Value Ref Range Status   Specimen Description BLOOD LEFT ANTECUBITAL  Final   Special Requests BOTTLES DRAWN AEROBIC AND ANAEROBIC 10CC  Final   Culture   Final           BLOOD CULTURE RECEIVED NO GROWTH TO DATE CULTURE WILL BE HELD FOR 5 DAYS BEFORE ISSUING A FINAL NEGATIVE REPORT Performed at [facility]    Report Status PENDING  Incomplete  Culture, blood (routine x 2)     Status: None (Preliminary result)   Collection Time: 07/03/14 10:23 AM  Result Value Ref Range Status   Specimen Description BLOOD LEFT HAND  Final    Special Requests BOTTLES DRAWN AEROBIC AND ANAEROBIC 10CC  Final   Culture   Final           BLOOD CULTURE RECEIVED NO GROWTH TO DATE CULTURE WILL BE HELD FOR 5 DAYS BEFORE ISSUING A FINAL NEGATIVE REPORT Performed at [facility]    Report Status PENDING  Incomplete     Labs: Results for orders placed or performed during the hospital encounter of 07/02/14 (from the past 48  hour(s))  Glucose, capillary     Status: Abnormal   Collection Time: 07/03/14  4:20 PM  Result Value Ref Range   Glucose-Capillary 192 (H) 70 - 99 mg/dL   Comment 1 Notify RN    Comment 2 Documented in Char   Glucose, capillary     Status: Abnormal   Collection Time: 07/03/14 10:17 PM  Result Value Ref Range   Glucose-Capillary 158 (H) 70 - 99 mg/dL   Comment 1 Notify RN    Comment 2 Documented in Char   Basic metabolic panel     Status: Abnormal   Collection Time: 07/04/14  6:04 AM  Result Value Ref Range   Sodium 138 135 - 145 mmol/L   Potassium 3.6 3.5 - 5.1 mmol/L   Chloride 107 96 - 112 mmol/L   CO2 24 19 - 32 mmol/L   Glucose, Bld 106 (H) 70 - 99 mg/dL   BUN 28 (H) 6 - 23 mg/dL   Creatinine, Ser 1.29 (H) 0.50 - 1.10 mg/dL   Calcium 8.5 8.4 - 10.5 mg/dL   GFR calc non Af Amer 42 (L) >90 mL/min   GFR calc Af Amer 48 (L) >90 mL/min    Comment: (NOTE) The eGFR has been calculated using the CKD EPI equation. This calculation has not been validated in all clinical situations. eGFR's persistently <90 mL/min signify possible Chronic Kidney Disease.    Anion gap 7 5 - 15  CBC     Status: Abnormal   Collection Time: 07/04/14  6:04 AM  Result Value Ref Range   WBC 8.6 4.0 - 10.5 K/uL   RBC 3.19 (L) 3.87 - 5.11 MIL/uL   Hemoglobin 9.5 (L) 12.0 - 15.0 g/dL   HCT 29.7 (L) 36.0 - 46.0 %   MCV 93.1 78.0 - 100.0 fL   MCH 29.8 26.0 - 34.0 pg   MCHC 32.0 30.0 - 36.0 g/dL   RDW 12.8 11.5 - 15.5 %   Platelets 163 150 - 400 K/uL  CK     Status: Abnormal   Collection Time: 07/04/14  6:04 AM  Result  Value Ref Range   Total CK 651 (H) 7 - 177 U/L  Hemoglobin A1c     Status: Abnormal   Collection Time: 07/04/14  6:04 AM  Result Value Ref Range   Hgb A1c MFr Bld 8.5 (H) 4.8 - 5.6 %    Comment: (NOTE)         Pre-diabetes: 5.7 - 6.4         Diabetes: >6.4         Glycemic control for adults with diabetes: <7.0    Mean Plasma Glucose 197 mg/dL    Comment: (NOTE) Performed At: [facility] [address] [place], [place] [id number] Lindon Romp MD QD:[id number]   Glucose, capillary     Status: Abnormal   Collection Time: 07/04/14  6:33 AM  Result Value Ref Range   Glucose-Capillary 108 (H) 70 - 99 mg/dL  Glucose, capillary     Status: Abnormal   Collection Time: 07/04/14 11:50 AM  Result Value Ref Range   Glucose-Capillary 170 (H) 70 - 99 mg/dL   Comment 1 Notify RN   Glucose, capillary     Status: Abnormal   Collection Time: 07/04/14  4:34 PM  Result Value Ref Range   Glucose-Capillary 233 (H) 70 - 99 mg/dL  Glucose, capillary     Status: Abnormal   Collection Time: 07/04/14 10:16 PM  Result Value  Ref Range   Glucose-Capillary 180 (H) 70 - 99 mg/dL  Glucose, capillary     Status: Abnormal   Collection Time: 07/05/14  6:13 AM  Result Value Ref Range   Glucose-Capillary 125 (H) 70 - 99 mg/dL  Basic metabolic panel     Status: Abnormal   Collection Time: 07/05/14  8:45 AM  Result Value Ref Range   Sodium 137 135 - 145 mmol/L   Potassium 3.8 3.5 - 5.1 mmol/L   Chloride 107 96 - 112 mmol/L   CO2 25 19 - 32 mmol/L   Glucose, Bld 140 (H) 70 - 99 mg/dL   BUN 26 (H) 6 - 23 mg/dL   Creatinine, Ser 1.30 (H) 0.50 - 1.10 mg/dL   Calcium 9.4 8.4 - 10.5 mg/dL   GFR calc non Af Amer 41 (L) >90 mL/min   GFR calc Af Amer 48 (L) >90 mL/min    Comment: (NOTE) The eGFR has been calculated using the CKD EPI equation. This calculation has not been validated in all clinical situations. eGFR's persistently <90 mL/min signify possible Chronic Kidney Disease.    Anion gap 5  5 - 15  Glucose, capillary     Status: Abnormal   Collection Time: 07/05/14 11:34 AM  Result Value Ref Range   Glucose-Capillary 175 (H) 70 - 99 mg/dL     HPI :Bridget Austin is a 69 y.o. female with a history of DM2, HN, and Diabetic Neuropathy who presents to the ED after 2 falls over the past 3 days. She was found on the floor and had been on the floor for several hours. She reports falling due to weakness in her legs , and denies any syncope or chest pain.   HOSPITAL COURSE:  Rhabdomyolysis: Initial CK was 2449, Secondary to prolonged immobilization/being on the floor. CPK significantly decreased with IV fluids.  Active Problems:  UTI (lower urinary tract infection) secondary to Escherichia coli, pansensitive: Initially started on IV Rocephin. Patient switched to Keflex for a week, blood cultures negative. Afebrile and without any leukocytosis.    Dehydration: Secondary to above. Hydrated with IV fluids on admission. Euvolemic on exam, have discontinued all IV fluids. Encourage fluid intake orally, and reassess any. Patient advised to hold lisinopril and Lasix until seen by PCP. Initial creatinine 1.5> improved after IV hydration to 1.3   Acute renal failure: Likely secondary to prerenal azotemia from above and from lisinopril/Lasix. Creatinine significantly improved. Continue to hold lisinopril and Lasix for now. Resume 3/22 if renal function is stable    Generalized Weakness: Likely secondary to dehydration and UTI. CT head negative for acute changes. Nonfocal exam. PT eval completed, recommendations are for home health PT with 24-hour supervision. Patient declined home health   Diabetes mellitus without complication: CBGs with moderate control-continue glipizide, and SSI. Resume rest of her medications on discharge.A1c 8.5   Hypertension: Blood pressure with moderate control-currently off all antihypertensive medication, continue to monitor off all antihypertensive resume  Lasix and lisinopril in one week if renal function is stable   Anemia: Likely secondary to chronic disease. Further workup/monitoring deferred to the outpatient setting   Neuropathy: resume Neurontin, dose reduced because of renal insufficiency and falls  Discharge Exam:   Blood pressure 156/52, pulse 74, temperature 98.2 F (36.8 C), temperature source Oral, resp. rate 20, height 5' 6"  (1.676 m), weight 97.478 kg (214 lb 14.4 oz), SpO2 97 %.  Gen Exam: Awake and alert with clear speech.  Neck: Supple, No JVD.  Chest:  B/L Clear.  CVS: S1 S2 Regular, no murmurs.  Abdomen: soft, BS +, non tender, non distended.  Extremities: no edema, lower extremities warm to touch. Neurologic: Non Focal.          Follow-up Information    Follow up with PCP. Schedule an appointment as soon as possible for a visit in 3 days.      SignedReyne Dumas 07/05/2014, 12:11 PM

## 2014-07-09 LAB — CULTURE, BLOOD (ROUTINE X 2)
CULTURE: NO GROWTH
Culture: NO GROWTH

## 2014-08-12 NOTE — Op Note (Signed)
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   PROCEDURE: Amputation of 3rd toe left foot to metatarsal phalangeal joint level.   SURGEON: Rhona RaiderMatthew G. Machi Whittaker, DPM   ASSISTANT: None.   HISTORY OF PRESENT ILLNESS: The patient has had a wound on the left 3rd toe for a number of weeks. When I saw her for the first time in my office approximately 2 weeks ago had suspicions of osteomyelitis. Subsequent x-ray showed demineralization, sequestration and lucency in the bone consistent with osteomyelitis in the distal middle phalanx with possible extension of the proximal phalanx.   ANESTHESIA: MAC with local anesthesia.   BLOOD LOSS: Less than 15 mL.   HEMOSTASIS: Ankle tourniquet at 250 mmHg for 9 minutes.   DESCRIPTION OF PROCEDURE: The patient was brought to the OR and placed on the OR table in the supine position. At this point, after MAC was achieved by the anesthesia team, I delivered local anesthesia to the base of the 3rd toe. The foot was then prepped and draped in the usual sterile manner. At this point, 2 semielliptical incisions were made over the base of the 3rd toe leaving enough viable skin on each side for primary closure. The toe was then dissected down to the metatarsophalangeal joint directly down to the bone and joint and removed in toto. The toe was then split and bone and soft tissue that was demineralized and affected by the infection were cultured and sent to pathology and microbiology. The toe was sent to pathology. The area was then copiously irrigated. Bleeders were clamped and bovied as required. Deep and superficial fascia layers were closed with 4-0 Vicryl in a continuous stitch. The skin was closed with a combination of 3-0 and 5-0 nylon and horizontal mattress and simple interrupted sutures. A  sterile compressive dressing was placed across the area consisting of Xeroform gauze, 4 x 4's, Kling and Kerlix and an ABD pad. The patient appeared to tolerate the procedure and anesthesia well. A new bandage was placed on the right foot where she has an ulcer on the great toe, but no surgery was necessary on that toe. The patient tolerated these procedures well and left the OR for the recovery room with vital signs stable and neurovascular status intact.   ____________________________ Rhona RaiderMatthew G. [place], DPM mgt:sb D: 11/18/2013 08:05:47 ET T: 11/18/2013 10:40:11 ET JOB#: [id number]  cc: Rhona RaiderMatthew G. Maryetta Shafer, DPM, <Dictator> Epimenio SarinMATTHEW G Eddrick Dilone MD ELECTRONICALLY SIGNED 12/28/2013 12:53

## 2014-09-20 DEATH — deceased

## 2014-10-30 ENCOUNTER — Telehealth: Payer: Self-pay | Admitting: Family Medicine

## 2014-10-30 NOTE — Telephone Encounter (Signed)
Pt sister, Eber JonesCarolyn states she dropped of forms to be completed for life insurance last week.  She is asking if these forms are ready to be picked up.  CB#[phone] or [phone]/MJ

## 2014-10-31 NOTE — Telephone Encounter (Signed)
Spoke to pt sister, Eber JonesCarolyn and she will come in today to bring paper work Joyce GrossKay is needing and pick up letter/MJ

## 2014-10-31 NOTE — Telephone Encounter (Signed)
I have complete a letter for her but she must discuss further dispensation with medical records, Joyce GrossKay.

## 2015-10-19 DIAGNOSIS — M549 Dorsalgia, unspecified: Secondary | ICD-10-CM

## 2015-10-19 DIAGNOSIS — B0229 Other postherpetic nervous system involvement: Secondary | ICD-10-CM | POA: Insufficient documentation

## 2015-10-19 DIAGNOSIS — N189 Chronic kidney disease, unspecified: Secondary | ICD-10-CM | POA: Insufficient documentation

## 2015-10-19 DIAGNOSIS — Z89429 Acquired absence of other toe(s), unspecified side: Secondary | ICD-10-CM | POA: Insufficient documentation

## 2015-10-19 DIAGNOSIS — G8929 Other chronic pain: Secondary | ICD-10-CM | POA: Insufficient documentation

## 2016-12-28 IMAGING — CT CT HEAD W/O CM
2 series · 16 of 30 positions shown, 18 images · non-contrast
Comparison: None.

CLINICAL DATA: Bilateral leg weakness for 2 months. Two falls
today. Initial encounter.

EXAM:
CT HEAD WITHOUT CONTRAST
TECHNIQUE: Contiguous axial images were obtained from the base of the skull
through the vertex without intravenous contrast.

[Series 201: head w/o, idose (1) · axial · non-contrast · 0.45mm/px · z∈[+81,+201]mm · 8 of 32 slices shown, 10 images]
[im 4/32  brain]
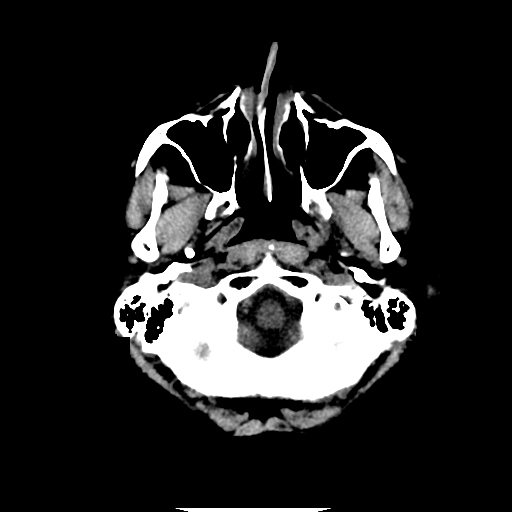
[im 4/32  bone]
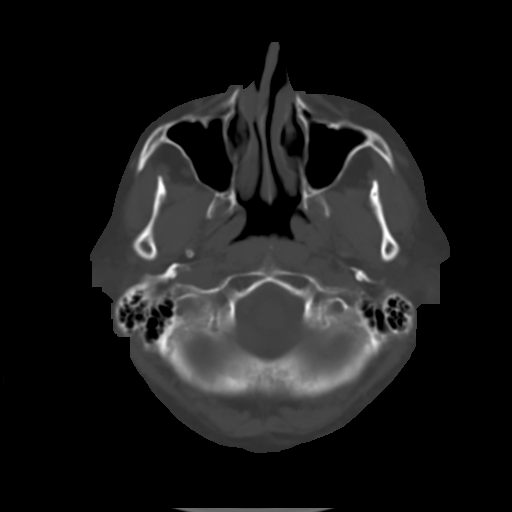
[im 7/32  brain]
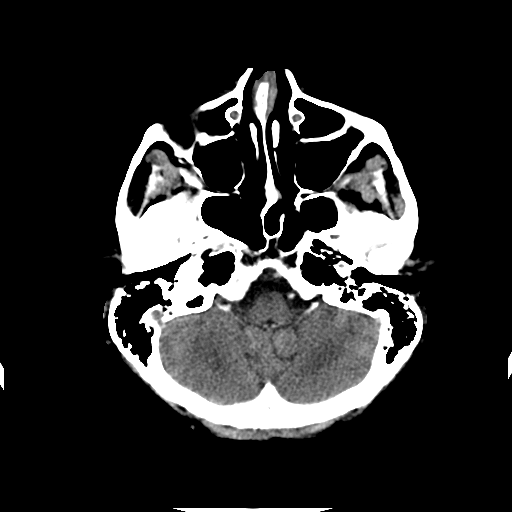
[im 11/32  brain]
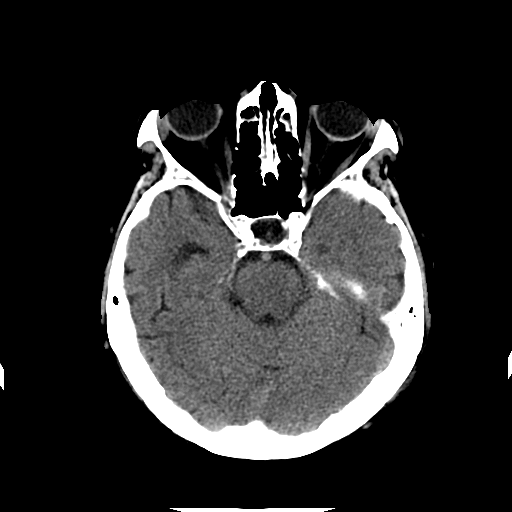
[im 14/32  brain]
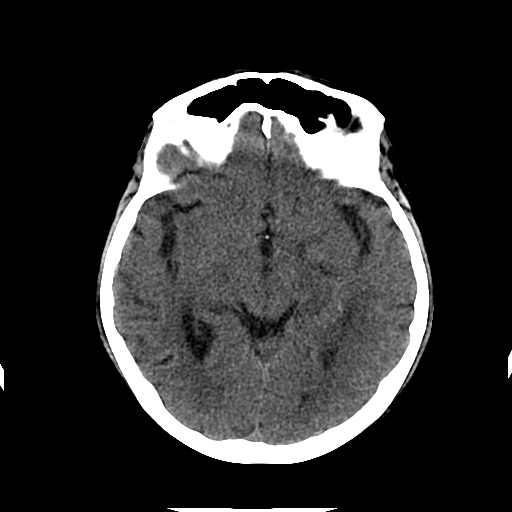
[im 18/32  brain]
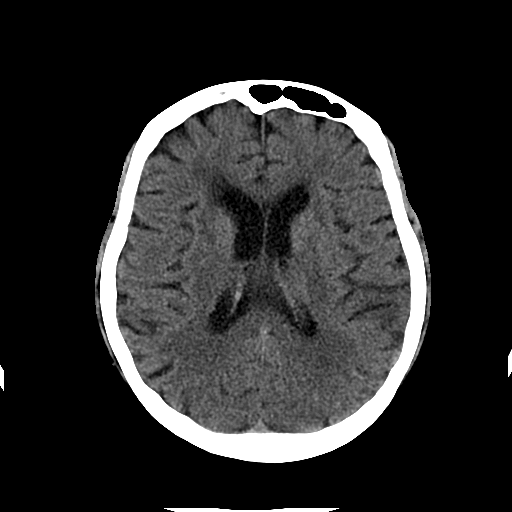
[im 18/32  bone]
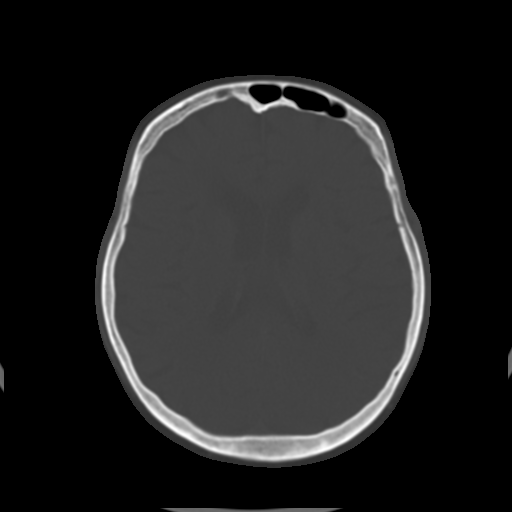
[im 21/32  brain]
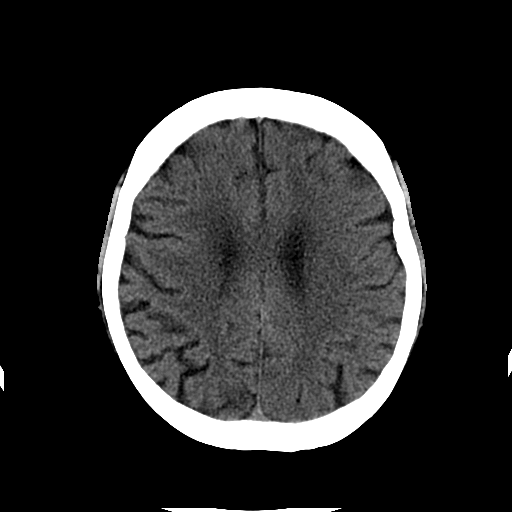
[im 25/32  brain]
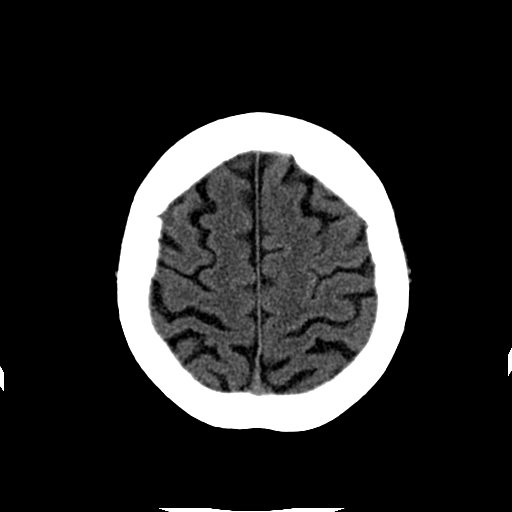
[im 28/32  brain]
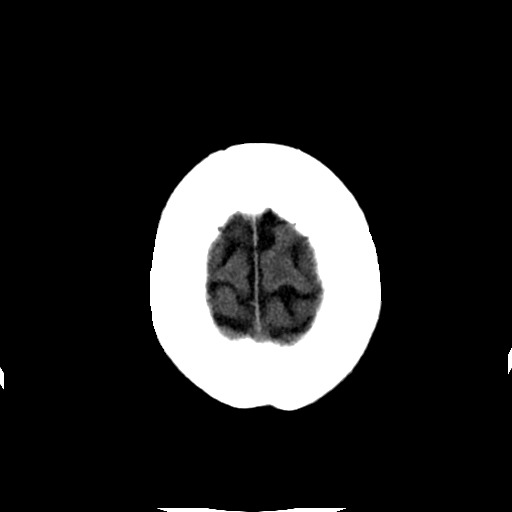

[Series 202: head w/o bone, idose (1) · axial · non-contrast · 0.45mm/px · z∈[+80,+205]mm · 8 of 64 slices shown]
[im 7/64  bone]
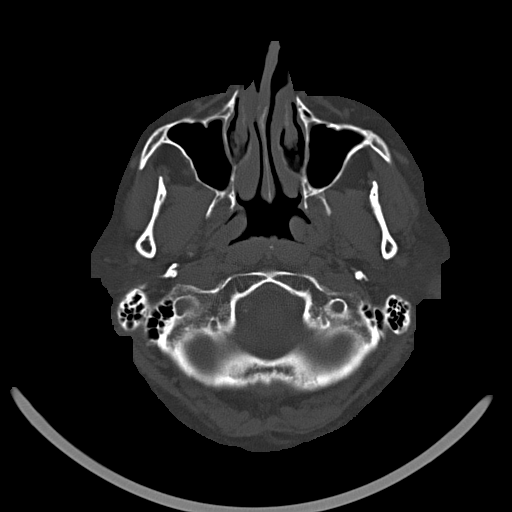
[im 14/64  bone]
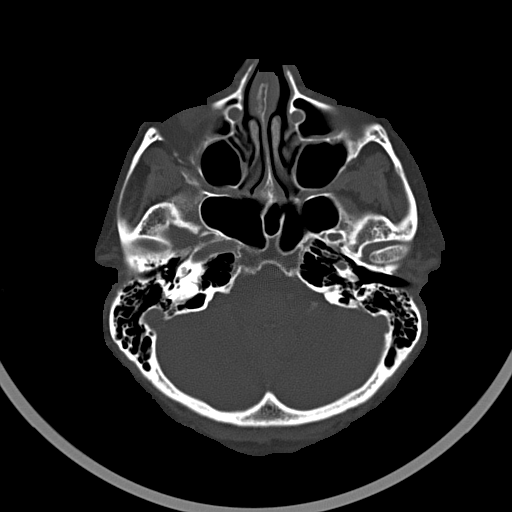
[im 20/64  bone]
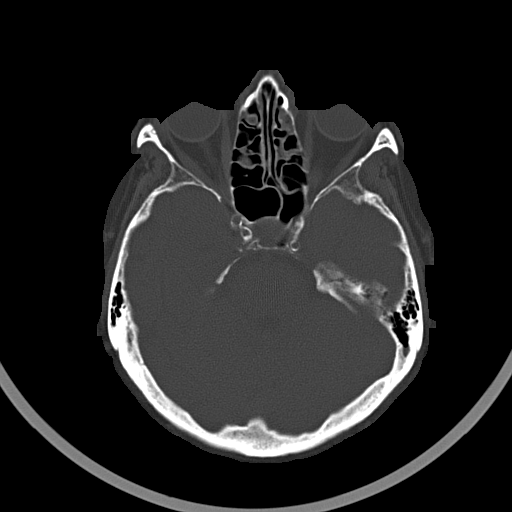
[im 27/64  bone]
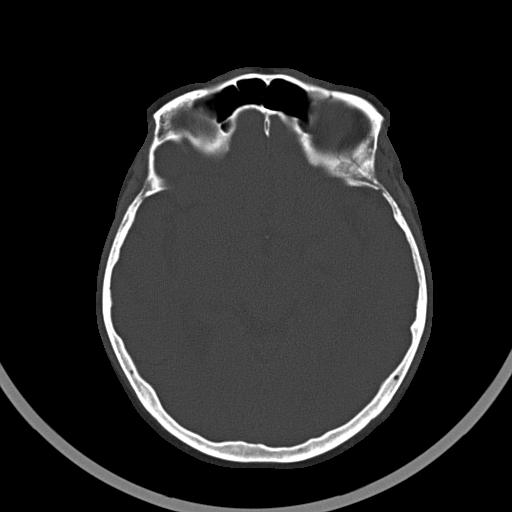
[im 37/64  bone]
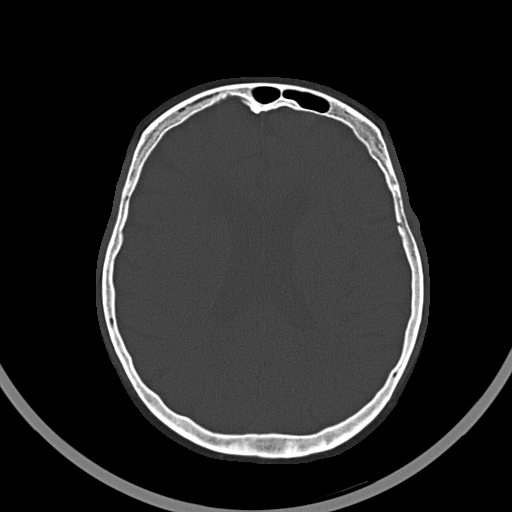
[im 44/64  bone]
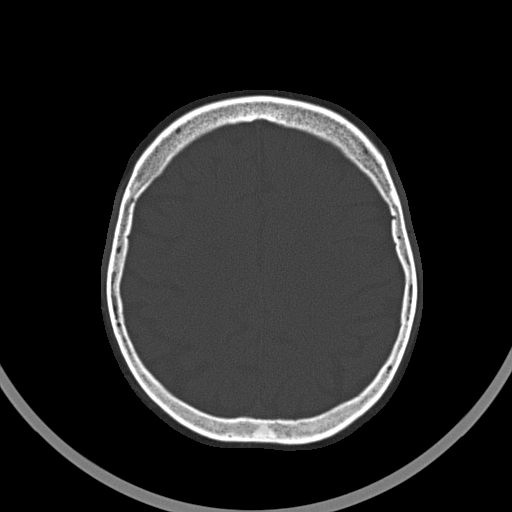
[im 50/64  bone]
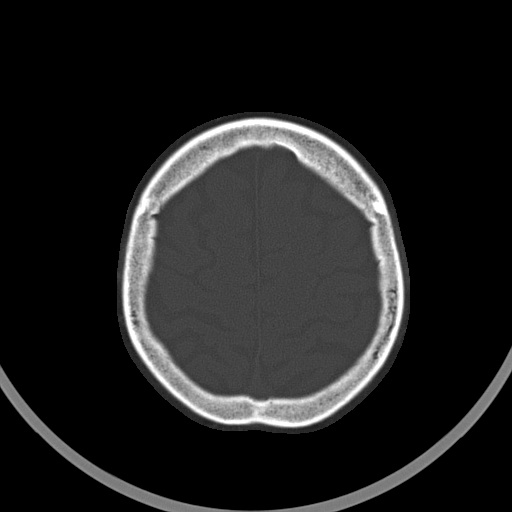
[im 57/64  bone]
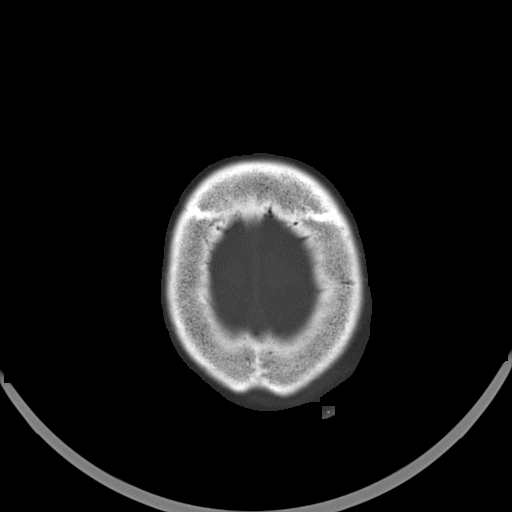

[16 of 30 positions shown; findings below may reference images not displayed]

FINDINGS: There is no evidence of acute infarction, mass lesion, or intra- or
extra-axial hemorrhage on CT.

Prominence of the ventricles and sulci suggests mild cortical volume
loss. Mild periventricular and subcortical white matter change
likely reflects small vessel ischemic microangiopathy.

The brainstem and fourth ventricle are within normal limits. The
basal ganglia are unremarkable in appearance. The cerebral
hemispheres demonstrate grossly normal gray-white differentiation.
No mass effect or midline shift is seen.

There is no evidence of fracture; visualized osseous structures are
unremarkable in appearance. The orbits are within normal limits. The
paranasal sinuses and mastoid air cells are well-aerated. No
significant soft tissue abnormalities are seen.
IMPRESSION: 1. No acute intracranial pathology seen on CT.
2. Mild cortical volume loss and scattered small vessel ischemic
microangiopathy.
